# Patient Record
Sex: Female | Born: 1940 | Race: White | Hispanic: No | Marital: Married | Smoking: Former smoker
Health system: Southern US, Community
[De-identification: ages and names within clinical notes are randomized; demographics above are authoritative.]

## PROBLEM LIST (undated history)

## (undated) DIAGNOSIS — F0390 Unspecified dementia without behavioral disturbance: Secondary | ICD-10-CM

## (undated) DIAGNOSIS — I2510 Atherosclerotic heart disease of native coronary artery without angina pectoris: Secondary | ICD-10-CM

## (undated) DIAGNOSIS — C801 Malignant (primary) neoplasm, unspecified: Secondary | ICD-10-CM

## (undated) DIAGNOSIS — J449 Chronic obstructive pulmonary disease, unspecified: Secondary | ICD-10-CM

## (undated) DIAGNOSIS — I209 Angina pectoris, unspecified: Secondary | ICD-10-CM

## (undated) DIAGNOSIS — I10 Essential (primary) hypertension: Secondary | ICD-10-CM

## (undated) HISTORY — PX: MASTECTOMY: SHX3

---

## 2014-09-06 ENCOUNTER — Other Ambulatory Visit: Payer: Self-pay

## 2014-09-08 ENCOUNTER — Inpatient Hospital Stay (HOSPITAL_COMMUNITY)
Admission: EM | Admit: 2014-09-08 | Discharge: 2014-09-24 | DRG: 308 | Disposition: E | Payer: Medicare Other | Attending: Pulmonary Disease | Admitting: Pulmonary Disease

## 2014-09-08 ENCOUNTER — Emergency Department (HOSPITAL_COMMUNITY): Payer: Medicare Other

## 2014-09-08 ENCOUNTER — Encounter (HOSPITAL_COMMUNITY): Payer: Self-pay | Admitting: Emergency Medicine

## 2014-09-08 DIAGNOSIS — E222 Syndrome of inappropriate secretion of antidiuretic hormone: Secondary | ICD-10-CM | POA: Diagnosis present

## 2014-09-08 DIAGNOSIS — J45909 Unspecified asthma, uncomplicated: Secondary | ICD-10-CM | POA: Diagnosis present

## 2014-09-08 DIAGNOSIS — J438 Other emphysema: Secondary | ICD-10-CM

## 2014-09-08 DIAGNOSIS — Z4659 Encounter for fitting and adjustment of other gastrointestinal appliance and device: Secondary | ICD-10-CM

## 2014-09-08 DIAGNOSIS — Y92230 Patient room in hospital as the place of occurrence of the external cause: Secondary | ICD-10-CM

## 2014-09-08 DIAGNOSIS — J9602 Acute respiratory failure with hypercapnia: Secondary | ICD-10-CM | POA: Diagnosis not present

## 2014-09-08 DIAGNOSIS — E872 Acidosis: Secondary | ICD-10-CM | POA: Diagnosis present

## 2014-09-08 DIAGNOSIS — Z8249 Family history of ischemic heart disease and other diseases of the circulatory system: Secondary | ICD-10-CM

## 2014-09-08 DIAGNOSIS — R579 Shock, unspecified: Secondary | ICD-10-CM | POA: Diagnosis not present

## 2014-09-08 DIAGNOSIS — Z515 Encounter for palliative care: Secondary | ICD-10-CM

## 2014-09-08 DIAGNOSIS — T45515A Adverse effect of anticoagulants, initial encounter: Secondary | ICD-10-CM | POA: Diagnosis not present

## 2014-09-08 DIAGNOSIS — E871 Hypo-osmolality and hyponatremia: Secondary | ICD-10-CM

## 2014-09-08 DIAGNOSIS — I10 Essential (primary) hypertension: Secondary | ICD-10-CM | POA: Diagnosis present

## 2014-09-08 DIAGNOSIS — G9340 Encephalopathy, unspecified: Secondary | ICD-10-CM

## 2014-09-08 DIAGNOSIS — J9600 Acute respiratory failure, unspecified whether with hypoxia or hypercapnia: Secondary | ICD-10-CM

## 2014-09-08 DIAGNOSIS — D689 Coagulation defect, unspecified: Secondary | ICD-10-CM | POA: Diagnosis not present

## 2014-09-08 DIAGNOSIS — Z9012 Acquired absence of left breast and nipple: Secondary | ICD-10-CM | POA: Diagnosis present

## 2014-09-08 DIAGNOSIS — J9601 Acute respiratory failure with hypoxia: Secondary | ICD-10-CM

## 2014-09-08 DIAGNOSIS — Z955 Presence of coronary angioplasty implant and graft: Secondary | ICD-10-CM

## 2014-09-08 DIAGNOSIS — D62 Acute posthemorrhagic anemia: Secondary | ICD-10-CM

## 2014-09-08 DIAGNOSIS — I4891 Unspecified atrial fibrillation: Principal | ICD-10-CM | POA: Diagnosis present

## 2014-09-08 DIAGNOSIS — Z66 Do not resuscitate: Secondary | ICD-10-CM | POA: Diagnosis not present

## 2014-09-08 DIAGNOSIS — F419 Anxiety disorder, unspecified: Secondary | ICD-10-CM | POA: Diagnosis not present

## 2014-09-08 DIAGNOSIS — K661 Hemoperitoneum: Secondary | ICD-10-CM | POA: Diagnosis not present

## 2014-09-08 DIAGNOSIS — N179 Acute kidney failure, unspecified: Secondary | ICD-10-CM

## 2014-09-08 DIAGNOSIS — D500 Iron deficiency anemia secondary to blood loss (chronic): Secondary | ICD-10-CM | POA: Diagnosis not present

## 2014-09-08 DIAGNOSIS — R401 Stupor: Secondary | ICD-10-CM

## 2014-09-08 DIAGNOSIS — N390 Urinary tract infection, site not specified: Secondary | ICD-10-CM | POA: Diagnosis present

## 2014-09-08 DIAGNOSIS — R739 Hyperglycemia, unspecified: Secondary | ICD-10-CM | POA: Diagnosis not present

## 2014-09-08 DIAGNOSIS — T4271XA Poisoning by unspecified antiepileptic and sedative-hypnotic drugs, accidental (unintentional), initial encounter: Secondary | ICD-10-CM | POA: Diagnosis not present

## 2014-09-08 DIAGNOSIS — J449 Chronic obstructive pulmonary disease, unspecified: Secondary | ICD-10-CM | POA: Diagnosis present

## 2014-09-08 DIAGNOSIS — F0390 Unspecified dementia without behavioral disturbance: Secondary | ICD-10-CM | POA: Diagnosis present

## 2014-09-08 DIAGNOSIS — Z87891 Personal history of nicotine dependence: Secondary | ICD-10-CM

## 2014-09-08 DIAGNOSIS — E876 Hypokalemia: Secondary | ICD-10-CM | POA: Diagnosis present

## 2014-09-08 DIAGNOSIS — R531 Weakness: Secondary | ICD-10-CM | POA: Diagnosis not present

## 2014-09-08 DIAGNOSIS — I2510 Atherosclerotic heart disease of native coronary artery without angina pectoris: Secondary | ICD-10-CM | POA: Diagnosis present

## 2014-09-08 DIAGNOSIS — Z0189 Encounter for other specified special examinations: Secondary | ICD-10-CM

## 2014-09-08 DIAGNOSIS — K567 Ileus, unspecified: Secondary | ICD-10-CM | POA: Diagnosis not present

## 2014-09-08 DIAGNOSIS — G92 Toxic encephalopathy: Secondary | ICD-10-CM | POA: Diagnosis present

## 2014-09-08 DIAGNOSIS — Z9981 Dependence on supplemental oxygen: Secondary | ICD-10-CM

## 2014-09-08 DIAGNOSIS — R64 Cachexia: Secondary | ICD-10-CM | POA: Diagnosis present

## 2014-09-08 DIAGNOSIS — Z853 Personal history of malignant neoplasm of breast: Secondary | ICD-10-CM

## 2014-09-08 DIAGNOSIS — F13230 Sedative, hypnotic or anxiolytic dependence with withdrawal, uncomplicated: Secondary | ICD-10-CM | POA: Diagnosis not present

## 2014-09-08 DIAGNOSIS — J441 Chronic obstructive pulmonary disease with (acute) exacerbation: Secondary | ICD-10-CM | POA: Diagnosis not present

## 2014-09-08 DIAGNOSIS — R451 Restlessness and agitation: Secondary | ICD-10-CM | POA: Diagnosis not present

## 2014-09-08 HISTORY — DX: Essential (primary) hypertension: I10

## 2014-09-08 HISTORY — DX: Atherosclerotic heart disease of native coronary artery without angina pectoris: I25.10

## 2014-09-08 HISTORY — DX: Unspecified dementia, unspecified severity, without behavioral disturbance, psychotic disturbance, mood disturbance, and anxiety: F03.90

## 2014-09-08 HISTORY — DX: Malignant (primary) neoplasm, unspecified: C80.1

## 2014-09-08 HISTORY — DX: Chronic obstructive pulmonary disease, unspecified: J44.9

## 2014-09-08 HISTORY — DX: Angina pectoris, unspecified: I20.9

## 2014-09-08 LAB — URINALYSIS, ROUTINE W REFLEX MICROSCOPIC
Bilirubin Urine: NEGATIVE
Glucose, UA: NEGATIVE mg/dL
HGB URINE DIPSTICK: NEGATIVE
KETONES UR: NEGATIVE mg/dL
Nitrite: NEGATIVE
Protein, ur: NEGATIVE mg/dL
Specific Gravity, Urine: 1.017 (ref 1.005–1.030)
UROBILINOGEN UA: 0.2 mg/dL (ref 0.0–1.0)
pH: 6 (ref 5.0–8.0)

## 2014-09-08 LAB — COMPREHENSIVE METABOLIC PANEL
ALT: 19 U/L (ref 0–35)
AST: 29 U/L (ref 0–37)
Albumin: 3.5 g/dL (ref 3.5–5.2)
Alkaline Phosphatase: 90 U/L (ref 39–117)
Anion gap: 18 — ABNORMAL HIGH (ref 5–15)
BILIRUBIN TOTAL: 0.4 mg/dL (ref 0.3–1.2)
BUN: 23 mg/dL (ref 6–23)
CHLORIDE: 86 meq/L — AB (ref 96–112)
CO2: 21 meq/L (ref 19–32)
CREATININE: 1.19 mg/dL — AB (ref 0.50–1.10)
Calcium: 10.1 mg/dL (ref 8.4–10.5)
GFR, EST AFRICAN AMERICAN: 52 mL/min — AB (ref >90)
GFR, EST NON AFRICAN AMERICAN: 44 mL/min — AB (ref >90)
Glucose, Bld: 127 mg/dL — ABNORMAL HIGH (ref 70–99)
Potassium: 3.5 mEq/L — ABNORMAL LOW (ref 3.7–5.3)
Sodium: 125 mEq/L — ABNORMAL LOW (ref 137–147)
Total Protein: 7.4 g/dL (ref 6.0–8.3)

## 2014-09-08 LAB — CBC WITH DIFFERENTIAL/PLATELET
Basophils Absolute: 0 10*3/uL (ref 0.0–0.1)
Basophils Relative: 0 % (ref 0–1)
EOS PCT: 2 % (ref 0–5)
Eosinophils Absolute: 0.2 10*3/uL (ref 0.0–0.7)
HCT: 40.4 % (ref 36.0–46.0)
HEMOGLOBIN: 13.7 g/dL (ref 12.0–15.0)
LYMPHS ABS: 1 10*3/uL (ref 0.7–4.0)
LYMPHS PCT: 11 % — AB (ref 12–46)
MCH: 31.3 pg (ref 26.0–34.0)
MCHC: 33.9 g/dL (ref 30.0–36.0)
MCV: 92.2 fL (ref 78.0–100.0)
MONO ABS: 1.2 10*3/uL — AB (ref 0.1–1.0)
Monocytes Relative: 14 % — ABNORMAL HIGH (ref 3–12)
NEUTROS ABS: 6.6 10*3/uL (ref 1.7–7.7)
Neutrophils Relative %: 73 % (ref 43–77)
Platelets: 351 10*3/uL (ref 150–400)
RBC: 4.38 MIL/uL (ref 3.87–5.11)
RDW: 13.4 % (ref 11.5–15.5)
WBC: 9 10*3/uL (ref 4.0–10.5)

## 2014-09-08 LAB — URINE MICROSCOPIC-ADD ON

## 2014-09-08 MED ORDER — SODIUM CHLORIDE 0.9 % IV BOLUS (SEPSIS)
500.0000 mL | Freq: Once | INTRAVENOUS | Status: AC
  Administered 2014-09-08: 500 mL via INTRAVENOUS

## 2014-09-08 MED ORDER — DEXTROSE 5 % IV SOLN
1.0000 g | Freq: Once | INTRAVENOUS | Status: AC
  Administered 2014-09-08: 1 g via INTRAVENOUS
  Filled 2014-09-08: qty 10

## 2014-09-08 MED ORDER — DILTIAZEM HCL 25 MG/5ML IV SOLN
10.0000 mg | Freq: Once | INTRAVENOUS | Status: AC
Start: 2014-09-08 — End: 2014-09-08
  Administered 2014-09-08: 10 mg via INTRAVENOUS
  Filled 2014-09-08: qty 5

## 2014-09-08 NOTE — ED Notes (Signed)
MD at bedside. 

## 2014-09-08 NOTE — ED Provider Notes (Signed)
CSN: [id number]     Arrival date & time 08/29/2014  1927 History   First MD Initiated Contact with Patient  2303     Chief Complaint  Patient presents with  . Weakness    HPI Last week she was having trouble with weakness which brought her into the ED.    She was admitted to the hospital and released.  Treated for a UTI.  She travelled here to see her brother.  In the last several days she has had trouble with poor appetite again, general weakness, shaky.  When she got up last night she seemed confused.  She was talking about her daughter but it didn't make any sense to her husband.  That did not persist today but they were concerned and brought her to the ED here.  No chest pain.  Occsnl shortness of breath.  No vomiting, occsnl loose stools.  No dysuria.  No fever.  No history of irregular heart rhythms. Past Medical History  Diagnosis Date  . Hypertension   . Cancer   . COPD (chronic obstructive pulmonary disease)    Past Surgical History  Procedure Laterality Date  . Mastectomy     No family history on file. History  Substance Use Topics  . Smoking status: Never Smoker   . Smokeless tobacco: Not on file  . Alcohol Use: Yes   OB History   Grav Para Term Preterm Abortions TAB SAB Ect Mult Living                 Review of Systems  All other systems reviewed and are negative.     Allergies  Review of patient's allergies indicates no known allergies.  Home Medications   Prior to Admission medications   Not on File   BP 115/58  Pulse 107  Temp(Src) 97.3 F (36.3 C)  Resp 23  SpO2 91% Physical Exam  Nursing note and vitals reviewed. Constitutional: No distress.  HENT:  Head: Normocephalic and atraumatic.  Right Ear: External ear normal.  Left Ear: External ear normal.  Eyes: Conjunctivae are normal. Right eye exhibits no discharge. Left eye exhibits no discharge. No scleral icterus.  Neck: Neck supple. No tracheal deviation present.  Cardiovascular:  Intact distal pulses.  An irregularly irregular rhythm present. Tachycardia present.   Pulmonary/Chest: Effort normal and breath sounds normal. No stridor. No respiratory distress. She has no wheezes. She has no rales.  Abdominal: Soft. Bowel sounds are normal. She exhibits no distension. There is no tenderness. There is no rebound and no guarding.  Musculoskeletal: She exhibits no edema and no tenderness.  Neurological: She is alert. She has normal strength. No cranial nerve deficit (no facial droop, extraocular movements intact, no slurred speech) or sensory deficit. She exhibits normal muscle tone. She displays no seizure activity. Coordination normal.  Skin: Skin is warm and dry. No rash noted. She is not diaphoretic.  Psychiatric: She has a normal mood and affect.    ED Course  Procedures (including critical care time) CRITICAL CARE Performed by: EXBMW,UXL Total critical care time: 35 Critical care time was exclusive of separately billable procedures and treating other patients. Critical care was necessary to treat or prevent imminent or life-threatening deterioration. Critical care was time spent personally by me on the following activities: development of treatment plan with patient and/or surrogate as well as nursing, discussions with consultants, evaluation of patient's response to treatment, examination of patient, obtaining history from patient or surrogate, ordering and performing treatments and  interventions, ordering and review of laboratory studies, ordering and review of radiographic studies, pulse oximetry and re-evaluation of patient's condition.  Labs Review Labs Reviewed  CBC WITH DIFFERENTIAL - Abnormal; Notable for the following:    Lymphocytes Relative 11 (*)    Monocytes Relative 14 (*)    Monocytes Absolute 1.2 (*)    All other components within normal limits  COMPREHENSIVE METABOLIC PANEL - Abnormal; Notable for the following:    Sodium 125 (*)    Potassium 3.5  (*)    Chloride 86 (*)    Glucose, Bld 127 (*)    Creatinine, Ser 1.19 (*)    GFR calc non Af Amer 44 (*)    GFR calc Af Amer 52 (*)    Anion gap 18 (*)    All other components within normal limits  URINALYSIS, ROUTINE W REFLEX MICROSCOPIC - Abnormal; Notable for the following:    APPearance CLOUDY (*)    Leukocytes, UA SMALL (*)    All other components within normal limits  URINE CULTURE  URINE MICROSCOPIC-ADD ON    Imaging Review No results found.   EKG Interpretation   Date/Time:  Friday September 08 2014 23:06:25 EDT Ventricular Rate:  139 PR Interval:    QRS Duration: 74 QT Interval:  287 QTC Calculation: 436 R Axis:   92 Text Interpretation:  Atrial fibrillation Ventricular premature complex  Right axis deviation Consider left ventricular hypertrophy Nonspecific T  abnormalities, lateral leads Artifact in lead(s) I II III aVR aVL aVF V1  V2 V3 V4 V5 poor quality, flutter vs fib? Confirmed by Joellyn Grandt  MD-J, Eden Toohey  [id number]) on 09/01/2014 11:08:32 PM     Medications  diltiazem (CARDIZEM) injection 10 mg (0 mg Intravenous Stopped 09/07/2014 2324)  sodium chloride 0.9 % bolus 500 mL (500 mLs Intravenous New Bag/Given 09/03/2014 2319)  cefTRIAXone (ROCEPHIN) 1 g in dextrose 5 % 50 mL IVPB (1 g Intravenous New Bag/Given 08/24/2014 2321)   HR improved with cardizem bolus.  Most recent 107.  Will admit to stepdown unit. MDM   Final diagnoses:  Atrial fibrillation, rapid  Hyponatremia  UTI (lower urinary tract infection)   A fib with rapid rate noted on the monitor while I was a the bedside.  Pt may be having episodes of a fib with RVR that has been causing her weakness.  She also has hyponatremia and UA still suggest a uti.   Will give dose of cardizem.  Start abx for uti.  Admit to medical service.  No focal neuro signs at this time to suggest stroke but will not    Dorie Rank, MD 09/09/14 [phone]

## 2014-09-08 NOTE — ED Notes (Signed)
Pt. reports generalized weakness , poor appetite , and diarrhea last night. Pt. had just completed her oral antibiotic for UTI yesterday , denies fever or chills.

## 2014-09-09 ENCOUNTER — Encounter (HOSPITAL_COMMUNITY): Payer: Self-pay | Admitting: *Deleted

## 2014-09-09 DIAGNOSIS — I4891 Unspecified atrial fibrillation: Secondary | ICD-10-CM | POA: Diagnosis present

## 2014-09-09 DIAGNOSIS — F419 Anxiety disorder, unspecified: Secondary | ICD-10-CM | POA: Diagnosis not present

## 2014-09-09 DIAGNOSIS — Z515 Encounter for palliative care: Secondary | ICD-10-CM | POA: Diagnosis not present

## 2014-09-09 DIAGNOSIS — J449 Chronic obstructive pulmonary disease, unspecified: Secondary | ICD-10-CM | POA: Diagnosis present

## 2014-09-09 DIAGNOSIS — Y92230 Patient room in hospital as the place of occurrence of the external cause: Secondary | ICD-10-CM | POA: Diagnosis not present

## 2014-09-09 DIAGNOSIS — N390 Urinary tract infection, site not specified: Secondary | ICD-10-CM

## 2014-09-09 DIAGNOSIS — D689 Coagulation defect, unspecified: Secondary | ICD-10-CM | POA: Diagnosis not present

## 2014-09-09 DIAGNOSIS — G9340 Encephalopathy, unspecified: Secondary | ICD-10-CM

## 2014-09-09 DIAGNOSIS — K567 Ileus, unspecified: Secondary | ICD-10-CM | POA: Diagnosis not present

## 2014-09-09 DIAGNOSIS — Z87891 Personal history of nicotine dependence: Secondary | ICD-10-CM | POA: Diagnosis not present

## 2014-09-09 DIAGNOSIS — T45515A Adverse effect of anticoagulants, initial encounter: Secondary | ICD-10-CM | POA: Diagnosis not present

## 2014-09-09 DIAGNOSIS — Z9012 Acquired absence of left breast and nipple: Secondary | ICD-10-CM | POA: Diagnosis present

## 2014-09-09 DIAGNOSIS — G92 Toxic encephalopathy: Secondary | ICD-10-CM | POA: Diagnosis present

## 2014-09-09 DIAGNOSIS — I2510 Atherosclerotic heart disease of native coronary artery without angina pectoris: Secondary | ICD-10-CM

## 2014-09-09 DIAGNOSIS — N179 Acute kidney failure, unspecified: Secondary | ICD-10-CM | POA: Diagnosis not present

## 2014-09-09 DIAGNOSIS — E222 Syndrome of inappropriate secretion of antidiuretic hormone: Secondary | ICD-10-CM | POA: Diagnosis present

## 2014-09-09 DIAGNOSIS — J45909 Unspecified asthma, uncomplicated: Secondary | ICD-10-CM | POA: Diagnosis present

## 2014-09-09 DIAGNOSIS — T4271XA Poisoning by unspecified antiepileptic and sedative-hypnotic drugs, accidental (unintentional), initial encounter: Secondary | ICD-10-CM | POA: Diagnosis not present

## 2014-09-09 DIAGNOSIS — Z8249 Family history of ischemic heart disease and other diseases of the circulatory system: Secondary | ICD-10-CM | POA: Diagnosis not present

## 2014-09-09 DIAGNOSIS — R739 Hyperglycemia, unspecified: Secondary | ICD-10-CM | POA: Diagnosis not present

## 2014-09-09 DIAGNOSIS — Z9981 Dependence on supplemental oxygen: Secondary | ICD-10-CM | POA: Diagnosis not present

## 2014-09-09 DIAGNOSIS — R451 Restlessness and agitation: Secondary | ICD-10-CM | POA: Diagnosis not present

## 2014-09-09 DIAGNOSIS — E876 Hypokalemia: Secondary | ICD-10-CM | POA: Diagnosis present

## 2014-09-09 DIAGNOSIS — E872 Acidosis: Secondary | ICD-10-CM | POA: Diagnosis present

## 2014-09-09 DIAGNOSIS — J9602 Acute respiratory failure with hypercapnia: Secondary | ICD-10-CM | POA: Diagnosis not present

## 2014-09-09 DIAGNOSIS — I10 Essential (primary) hypertension: Secondary | ICD-10-CM

## 2014-09-09 DIAGNOSIS — J441 Chronic obstructive pulmonary disease with (acute) exacerbation: Secondary | ICD-10-CM | POA: Diagnosis not present

## 2014-09-09 DIAGNOSIS — K661 Hemoperitoneum: Secondary | ICD-10-CM | POA: Diagnosis not present

## 2014-09-09 DIAGNOSIS — D500 Iron deficiency anemia secondary to blood loss (chronic): Secondary | ICD-10-CM | POA: Diagnosis not present

## 2014-09-09 DIAGNOSIS — Z853 Personal history of malignant neoplasm of breast: Secondary | ICD-10-CM | POA: Diagnosis not present

## 2014-09-09 DIAGNOSIS — R579 Shock, unspecified: Secondary | ICD-10-CM | POA: Diagnosis not present

## 2014-09-09 DIAGNOSIS — R64 Cachexia: Secondary | ICD-10-CM | POA: Diagnosis present

## 2014-09-09 DIAGNOSIS — Z66 Do not resuscitate: Secondary | ICD-10-CM | POA: Diagnosis not present

## 2014-09-09 DIAGNOSIS — F13230 Sedative, hypnotic or anxiolytic dependence with withdrawal, uncomplicated: Secondary | ICD-10-CM | POA: Diagnosis not present

## 2014-09-09 DIAGNOSIS — J438 Other emphysema: Secondary | ICD-10-CM

## 2014-09-09 DIAGNOSIS — F0390 Unspecified dementia without behavioral disturbance: Secondary | ICD-10-CM | POA: Diagnosis present

## 2014-09-09 DIAGNOSIS — E871 Hypo-osmolality and hyponatremia: Secondary | ICD-10-CM | POA: Diagnosis present

## 2014-09-09 DIAGNOSIS — R531 Weakness: Secondary | ICD-10-CM | POA: Diagnosis present

## 2014-09-09 DIAGNOSIS — Z955 Presence of coronary angioplasty implant and graft: Secondary | ICD-10-CM | POA: Diagnosis not present

## 2014-09-09 LAB — NA AND K (SODIUM & POTASSIUM), RAND UR
Potassium Urine: 57 mEq/L
Sodium, Ur: 59 mEq/L

## 2014-09-09 LAB — CBC
HCT: 36.7 % (ref 36.0–46.0)
Hemoglobin: 12.8 g/dL (ref 12.0–15.0)
MCH: 32.2 pg (ref 26.0–34.0)
MCHC: 34.9 g/dL (ref 30.0–36.0)
MCV: 92.4 fL (ref 78.0–100.0)
PLATELETS: 305 10*3/uL (ref 150–400)
RBC: 3.97 MIL/uL (ref 3.87–5.11)
RDW: 13.5 % (ref 11.5–15.5)
WBC: 9.1 10*3/uL (ref 4.0–10.5)

## 2014-09-09 LAB — BASIC METABOLIC PANEL
Anion gap: 15 (ref 5–15)
BUN: 22 mg/dL (ref 6–23)
CO2: 20 mEq/L (ref 19–32)
Calcium: 9.3 mg/dL (ref 8.4–10.5)
Chloride: 89 mEq/L — ABNORMAL LOW (ref 96–112)
Creatinine, Ser: 1.09 mg/dL (ref 0.50–1.10)
GFR calc Af Amer: 57 mL/min — ABNORMAL LOW (ref >90)
GFR, EST NON AFRICAN AMERICAN: 49 mL/min — AB (ref >90)
Glucose, Bld: 114 mg/dL — ABNORMAL HIGH (ref 70–99)
POTASSIUM: 3 meq/L — AB (ref 3.7–5.3)
SODIUM: 124 meq/L — AB (ref 137–147)

## 2014-09-09 LAB — OSMOLALITY, URINE: OSMOLALITY UR: 454 mosm/kg (ref 390–1090)

## 2014-09-09 LAB — TSH: TSH: 1.01 u[IU]/mL (ref 0.350–4.500)

## 2014-09-09 LAB — MRSA PCR SCREENING: MRSA BY PCR: NEGATIVE

## 2014-09-09 MED ORDER — ONDANSETRON HCL 4 MG/2ML IJ SOLN: 4.0000 mg | Freq: Four times a day (QID) | INTRAMUSCULAR | Status: DC | PRN

## 2014-09-09 MED ORDER — ACETAMINOPHEN 325 MG PO TABS
650.0000 mg | ORAL_TABLET | Freq: Four times a day (QID) | ORAL | Status: DC | PRN
  Administered 2014-09-10: 650 mg via ORAL
  Filled 2014-09-09: qty 2

## 2014-09-09 MED ORDER — DILTIAZEM HCL 30 MG PO TABS
30.0000 mg | ORAL_TABLET | Freq: Four times a day (QID) | ORAL | Status: AC
  Administered 2014-09-10 (×4): 30 mg via ORAL
  Filled 2014-09-09 (×6): qty 1

## 2014-09-09 MED ORDER — DEXTROSE 5 % IV SOLN
1.0000 g | INTRAVENOUS | Status: DC
  Administered 2014-09-09 – 2014-09-11 (×3): 1 g via INTRAVENOUS
  Filled 2014-09-09 (×6): qty 10

## 2014-09-09 MED ORDER — DILTIAZEM HCL 100 MG IV SOLR
5.0000 mg/h | INTRAVENOUS | Status: AC
  Administered 2014-09-09: 5 mg/h via INTRAVENOUS
  Administered 2014-09-09: 10 mg/h via INTRAVENOUS

## 2014-09-09 MED ORDER — ALUM & MAG HYDROXIDE-SIMETH 200-200-20 MG/5ML PO SUSP: 30.0000 mL | Freq: Four times a day (QID) | ORAL | Status: DC | PRN

## 2014-09-09 MED ORDER — BUPROPION HCL ER (SR) 150 MG PO TB12
150.0000 mg | ORAL_TABLET | Freq: Two times a day (BID) | ORAL | Status: DC
  Administered 2014-09-09 – 2014-09-11 (×5): 150 mg via ORAL
  Filled 2014-09-09 (×9): qty 1

## 2014-09-09 MED ORDER — OXYCODONE HCL 5 MG PO TABS: 5.0000 mg | ORAL_TABLET | ORAL | Status: DC | PRN

## 2014-09-09 MED ORDER — METOPROLOL TARTRATE 12.5 MG HALF TABLET
12.5000 mg | ORAL_TABLET | Freq: Two times a day (BID) | ORAL | Status: DC
  Administered 2014-09-09 – 2014-09-11 (×5): 12.5 mg via ORAL
  Filled 2014-09-09 (×6): qty 1

## 2014-09-09 MED ORDER — ATORVASTATIN CALCIUM 80 MG PO TABS
80.0000 mg | ORAL_TABLET | Freq: Every day | ORAL | Status: DC
  Administered 2014-09-09 – 2014-09-10 (×2): 80 mg via ORAL
  Filled 2014-09-09 (×4): qty 1

## 2014-09-09 MED ORDER — ATORVASTATIN CALCIUM 80 MG PO TABS
80.0000 mg | ORAL_TABLET | Freq: Every day | ORAL | Status: DC
  Filled 2014-09-09: qty 1

## 2014-09-09 MED ORDER — POTASSIUM CHLORIDE CRYS ER 20 MEQ PO TBCR
40.0000 meq | EXTENDED_RELEASE_TABLET | Freq: Two times a day (BID) | ORAL | Status: AC
  Administered 2014-09-09 – 2014-09-10 (×3): 40 meq via ORAL
  Filled 2014-09-09 (×3): qty 2

## 2014-09-09 MED ORDER — ENOXAPARIN SODIUM 60 MG/0.6ML ~~LOC~~ SOLN
50.0000 mg | Freq: Two times a day (BID) | SUBCUTANEOUS | Status: DC
  Administered 2014-09-09 – 2014-09-12 (×8): 50 mg via SUBCUTANEOUS
  Filled 2014-09-09 (×12): qty 0.6

## 2014-09-09 MED ORDER — RALOXIFENE HCL 60 MG PO TABS
60.0000 mg | ORAL_TABLET | Freq: Every day | ORAL | Status: DC
  Administered 2014-09-09 – 2014-09-11 (×3): 60 mg via ORAL
  Filled 2014-09-09 (×3): qty 1

## 2014-09-09 MED ORDER — ALPRAZOLAM 0.5 MG PO TABS
1.0000 mg | ORAL_TABLET | Freq: Four times a day (QID) | ORAL | Status: DC | PRN
  Filled 2014-09-09: qty 2

## 2014-09-09 MED ORDER — ACETAMINOPHEN 650 MG RE SUPP: 650.0000 mg | Freq: Four times a day (QID) | RECTAL | Status: DC | PRN

## 2014-09-09 MED ORDER — ALBUTEROL SULFATE HFA 108 (90 BASE) MCG/ACT IN AERS: 2.0000 | INHALATION_SPRAY | Freq: Four times a day (QID) | RESPIRATORY_TRACT | Status: DC | PRN

## 2014-09-09 MED ORDER — VITAMIN D3 25 MCG (1000 UNIT) PO TABS
5000.0000 [IU] | ORAL_TABLET | Freq: Every day | ORAL | Status: DC
  Administered 2014-09-09 – 2014-09-11 (×3): 5000 [IU] via ORAL
  Filled 2014-09-09 (×3): qty 5

## 2014-09-09 MED ORDER — SODIUM CHLORIDE 0.9 % IV SOLN
INTRAVENOUS | Status: DC
  Administered 2014-09-09: 16:00:00 via INTRAVENOUS
  Administered 2014-09-09 – 2014-09-10 (×2): 75 mL/h via INTRAVENOUS
  Administered 2014-09-10: 11:00:00 via INTRAVENOUS
  Administered 2014-09-11: 75 mL/h via INTRAVENOUS

## 2014-09-09 MED ORDER — ASPIRIN EC 81 MG PO TBEC
81.0000 mg | DELAYED_RELEASE_TABLET | Freq: Every day | ORAL | Status: DC
  Administered 2014-09-09 – 2014-09-11 (×3): 81 mg via ORAL
  Filled 2014-09-09 (×3): qty 1

## 2014-09-09 MED ORDER — HYDROMORPHONE HCL 1 MG/ML IJ SOLN: 0.5000 mg | INTRAMUSCULAR | Status: DC | PRN

## 2014-09-09 MED ORDER — IPRATROPIUM-ALBUTEROL 0.5-2.5 (3) MG/3ML IN SOLN: 3.0000 mL | Freq: Four times a day (QID) | RESPIRATORY_TRACT | Status: DC | PRN

## 2014-09-09 MED ORDER — ONDANSETRON HCL 4 MG PO TABS: 4.0000 mg | ORAL_TABLET | Freq: Four times a day (QID) | ORAL | Status: DC | PRN

## 2014-09-09 NOTE — H&P (Addendum)
[facility] Admission History and Physical       [name]:[id number] DOB: 1941-09-06 DOA: 09/18/2014  Referring physician:  EDP PCP: No primary provider on file.  Specialists:   Chief Complaint: Weakness  HPI: Carmen Dean is a 73 y.o. female with a history of COPD, Asthma, and Remote history of Left Breast Cancer S/P Mastectomy  (1998) who presents to the ED with complaints of weakness and poor appetite and confusion for the past 4 days since she was discharged from the hospital in [place] where she had been admitted on 10/07 and treated for a UTI.   She was evaluated in the ED and was found have Atrial Fibrillation with RVR up to a rate of 148.   She was also found to have Hyponatremia with a sodium level of 125.  She denied having any chest pain or palpitations.  She was administered IV Cardizem 10 mg X 1 dose which temporarily improved her rate, and then she was started on and IV Cardizem drip and referred for medical admission.     Review of Systems:  Constitutional: No Weight Loss, No Weight Gain, Night Sweats, Fevers, Chills, Dizziness, Fatigue, +Generalized Weakness HEENT: No Headaches, Difficulty Swallowing,Tooth/Dental Problems,Sore Throat,  No Sneezing, Rhinitis, Ear Ache, Nasal Congestion, or Post Nasal Drip,  Cardio-vascular:  No Chest pain, Orthopnea, PND, Edema in Lower Extremities, Anasarca, Dizziness, Palpitations  Resp: No Dyspnea, No DOE, No Productive Cough, No Non-Productive Cough, No Hemoptysis, No Wheezing.    GI: No Heartburn, Indigestion, Abdominal Pain, Nausea, Vomiting, Diarrhea, Hematemesis, Hematochezia, Melena, Change in Bowel Habits,  +Loss of Appetite  GU: No Dysuria, Change in Color of Urine, No Urgency or Frequency, No Flank pain.  Musculoskeletal: No Joint Pain or Swelling, No Decreased Range of Motion, No Back Pain.  Neurologic: No Syncope, No Seizures, Muscle Weakness, Paresthesia, Vision Disturbance or Loss, No Diplopia,  No Vertigo, No Difficulty Walking,  Skin: No Rash or Lesions. Psych: No Change in Mood or Affect, No Depression or Anxiety, No Memory loss, +Confusion, or Hallucinations   Past Medical History  Diagnosis Date  . Hypertension   . Cancer   . COPD (chronic obstructive pulmonary disease)       Past Surgical History  Procedure Laterality Date  . Mastectomy         Prior to Admission medications   Medication Sig Start Date End Date Taking? Authorizing Provider  albuterol (PROVENTIL HFA;VENTOLIN HFA) 108 (90 BASE) MCG/ACT inhaler Inhale 2 puffs into the lungs every 6 (six) hours as needed for wheezing or shortness of breath.   Yes Historical Provider, MD  ALPRAZolam Duanne Moron) 1 MG tablet Take 1 mg by mouth 4 (four) times daily as needed for anxiety.   Yes Historical Provider, MD  aspirin EC 81 MG tablet Take 81 mg by mouth daily.   Yes Historical Provider, MD  atorvastatin (LIPITOR) 80 MG tablet Take 80 mg by mouth daily.   Yes Historical Provider, MD  buPROPion (WELLBUTRIN SR) 150 MG 12 hr tablet Take 150 mg by mouth 2 (two) times daily.   Yes Historical Provider, MD  Cholecalciferol (VITAMIN D-3) 5000 UNITS TABS Take 5,000 Units by mouth daily.   Yes Historical Provider, MD  hydrochlorothiazide (HYDRODIURIL) 25 MG tablet Take 25 mg by mouth daily.   Yes Historical Provider, MD  Ipratropium-Albuterol (COMBIVENT RESPIMAT) 20-100 MCG/ACT AERS respimat Inhale 1 puff into the lungs every 6 (six) hours as needed for wheezing.   Yes Historical Provider, MD  metoprolol tartrate (LOPRESSOR) 25 MG tablet Take 12.5 mg by mouth 2 (two) times daily.   Yes Historical Provider, MD  Propylene Glycol (SYSTANE BALANCE OP) Place 2 drops into both eyes 2 (two) times daily as needed (dry eyes).   Yes Historical Provider, MD  raloxifene (EVISTA) 60 MG tablet Take 60 mg by mouth daily.   Yes Historical Provider, MD      No Known Allergies   Social History:  reports that she has never smoked. She does not  have any smokeless tobacco history on file. She reports that she drinks alcohol. She reports that she does not use illicit drugs.     No family history on file.     Physical Exam:  GEN:  Pleasant Elderly Well Nourished and Well Developed  73 y.o. Caucasian female examined and in no acute distress; cooperative with exam Filed Vitals:   09/20/2014 2256 08/24/2014 2344 09/09/14 0000 09/09/14 0107  BP: 118/68 125/51 115/58 127/59  Pulse: 148  107 130  Temp:      Resp: 36  23 19  SpO2: 94%  91% 99%   Blood pressure 127/59, pulse 130, temperature 97.3 F (36.3 C), resp. rate 19, SpO2 99.00%. PSYCH: She is alert and oriented x4; does not appear anxious does not appear depressed; affect is normal HEENT: Normocephalic and Atraumatic, Mucous membranes pink; PERRLA; EOM intact; Fundi:  Benign;  No scleral icterus, Nares: Patent, Oropharynx: Clear, Edentulous,    Neck:  FROM, No Cervical Lymphadenopathy nor Thyromegaly or Carotid Bruit; No JVD; Breasts:: Not examined CHEST WALL: No tenderness CHEST: Normal respiration, clear to auscultation bilaterally HEART: Regular rate and rhythm; no murmurs rubs or gallops BACK: No kyphosis or scoliosis; No CVA tenderness ABDOMEN: Positive Bowel Sounds, Soft Non-Tender; No Masses, No Organomegaly Rectal Exam: Not done EXTREMITIES: No Cyanosis, Clubbing, or Edema; No Ulcerations. Genitalia: not examined PULSES: 2+ and symmetric SKIN: Normal hydration no rash or ulceration CNS:  Alert and Oriented x 4, No focal Deficits Vascular: pulses palpable throughout    Labs on Admission:  Basic Metabolic Panel:  Recent Labs Lab 08/30/2014 1958  NA 125*  K 3.5*  CL 86*  CO2 21  GLUCOSE 127*  BUN 23  CREATININE 1.19*  CALCIUM 10.1   Liver Function Tests:  Recent Labs Lab 09/04/2014 1958  AST 29  ALT 19  ALKPHOS 90  BILITOT 0.4  PROT 7.4  ALBUMIN 3.5   No results found for this basename: LIPASE, AMYLASE,  in the last 168 hours No results found for  this basename: AMMONIA,  in the last 168 hours CBC:  Recent Labs Lab 08/31/2014 1958  WBC 9.0  NEUTROABS 6.6  HGB 13.7  HCT 40.4  MCV 92.2  PLT 351   Cardiac Enzymes: No results found for this basename: CKTOTAL, CKMB, CKMBINDEX, TROPONINI,  in the last 168 hours  BNP (last 3 results) No results found for this basename: PROBNP,  in the last 8760 hours CBG: No results found for this basename: GLUCAP,  in the last 168 hours  Radiological Exams on Admission: Dg Chest Portable 1 View  09/09/2014   CLINICAL DATA:  Weakness.  Loss of appetite.  Initial encounter  EXAM: PORTABLE CHEST - 1 VIEW  COMPARISON:  None.  FINDINGS: No cardiomegaly.  Negative aortic contours.  Pulmonary hyperinflation and apical emphysematous changes. No edema, effusion, pneumothorax, or convincing pneumonia. Slight increased density at the peripheral left base is likely from overlapping shadows, including EKG leads.  Left axillary dissection  and mastectomy.  IMPRESSION: 1.  No active disease. 2. COPD.   Electronically Signed   By: Jorje Guild M.D.   On: 09/09/2014 00:28     EKG: Independently reviewed. Atrial Fibrillation rate 139   Assessment/Plan:   73 y.o. female with   Principal Problem:   1.   Atrial fibrillation with RVR   IV Cardizem Drip    Cardiac Monitoring   Check TSH Level   2D ECHO in AM     Active Problems:   2.   Hyponatremia- due to HCTZ Rx   Send Urine OSMs, and Electrolytes   IVFs with NSS       3.   UTI (lower urinary tract infection)- partially Treated   Urine C+S sent   IV Rocephin    IVFs     4.   Acute encephalopathy- Most Likely Metabolic due to #2, and #1   Monitor      5.   Essential hypertension   Monitor BPs   Continue Metoprolol Rx       6.   COPD (chronic obstructive pulmonary disease)- Stable   Continue Home regimen PRN      7.   CAD (coronary artery disease)- stable   Continue Metoprolol, Atorvastatin, and ASA rx   Cardiac Monitoring        8.   DVT Prophylaxis   Full dose Lovenox   Transition to Coumadin Rx or newer Agent      Code Status:   FULL CODE Family Communication:    Husband at bedside Disposition Plan:       Inpatient  Time spent:  [place] [facility] Pager [phone]   If [place] Please Contact the Day Rounding Team MD for [facility]  If 7PM-7AM, Please Contact Night-Floor Coverage  [email or web address] Password [email or web address] 09/09/2014, 1:39 AM

## 2014-09-09 NOTE — ED Notes (Signed)
Pharmacy called for daily medications 

## 2014-09-09 NOTE — ED Notes (Signed)
Lunch tray ordered 

## 2014-09-09 NOTE — ED Notes (Signed)
Meal ordered

## 2014-09-09 NOTE — Progress Notes (Signed)
[place] TEAM 1 - Stepdown/ICU TEAM Progress Note  MONITA SWIER QIW:[id number] DOB: December 01, 1940 DOA: 09/20/2014 PCP: No primary provider on file.  Admit HPI / Brief Narrative: 73 y.o. female with a history of COPD, Asthma, and remote history of Left Breast Cancer S/P Mastectomy (1998) who presented to the ED with complaints of weakness and poor appetite and confusion for 4 days since she was discharged from a hospital in [place] where she had been admitted on 10/07 for a UTI.   In the ED she was found to be in Atrial Fibrillation with RVR up to a rate of 148. She was also found to have Hyponatremia with a sodium level of 125. She denied chest pain or palpitations. She was administered IV Cardizem 10 mg X 1 dose which temporarily improved her rate, and then she was started on and IV Cardizem drip.   HPI/Subjective: Pt seen for f/u visit.  Assessment/Plan:  Acute afib w/ RVR  cardizem gtt - full dose lovenox  Hyponatremia Stopped HCTZ  UTI  Toxic metabolic encephalopathy  Hypokalemia Check Mg  HTN  COPD  Hx of CAD  Remote hx of L breast CA  Code Status: FULL Family Communication: husband at bedside  Disposition Plan: SDU  Consultants: None  Procedures: none  Antibiotics: Rocephin 10/16 >  DVT prophylaxis: Full dose lovenox  Objective: Blood pressure 105/42, pulse 64, temperature 97.3 F (36.3 C), resp. rate 21, height 5\' 4"  (1.626 m), weight 48.988 kg (108 lb), SpO2 98.00%. No intake or output data in the 24 hours ending 09/09/14 1357  Exam: F/u exam completed  Data Reviewed: Basic Metabolic Panel:  Recent Labs Lab 09/19/2014 1958 09/09/14 0506  NA 125* 124*  K 3.5* 3.0*  CL 86* 89*  CO2 21 20  GLUCOSE 127* 114*  BUN 23 22  CREATININE 1.19* 1.09  CALCIUM 10.1 9.3    Liver Function Tests:  Recent Labs Lab 08/28/2014 1958  AST 29  ALT 19  ALKPHOS 90  BILITOT 0.4  PROT 7.4  ALBUMIN 3.5    Coags: No results found for this  basename: PT, INR,  in the last 168 hours No results found for this basename: PTT,  in the last 168 hours  CBC:  Recent Labs Lab 09/06/2014 1958 09/09/14 0506  WBC 9.0 9.1  NEUTROABS 6.6  --   HGB 13.7 12.8  HCT 40.4 36.7  MCV 92.2 92.4  PLT 351 305    Cardiac Enzymes: No results found for this basename: CKTOTAL, CKMB, CKMBINDEX, TROPONINI,  in the last 168 hours BNP (last 3 results) No results found for this basename: PROBNP,  in the last 8760 hours  CBG: No results found for this basename: GLUCAP,  in the last 168 hours  No results found for this or any previous visit (from the past 240 hour(s)).   Studies:  Recent x-ray studies have been reviewed in detail by the Attending Physician  Scheduled Meds:  Scheduled Meds: . aspirin EC  81 mg Oral Daily  . atorvastatin  80 mg Oral q1800  . buPROPion  150 mg Oral BID WC  . cholecalciferol  5,000 Units Oral Daily  . enoxaparin (LOVENOX) injection  50 mg Subcutaneous BID  . metoprolol tartrate  12.5 mg Oral BID  . raloxifene  60 mg Oral Daily    Time spent on care of this patient: 25+ mins   Khris Jansson T , MD   [facility] Office  [phone] Pager - Text Page per  Amion as per below:  On-Call/Text Page:      [email or web address]      password [email or web address]  If 7PM-7AM, please contact night-coverage [email or web address] Password [email or web address] 09/09/2014, 1:57 PM   LOS: 1 day

## 2014-09-09 NOTE — ED Notes (Signed)
Admitting MD paged regarding cardizem drip

## 2014-09-10 ENCOUNTER — Encounter (HOSPITAL_COMMUNITY): Payer: Self-pay

## 2014-09-10 DIAGNOSIS — I369 Nonrheumatic tricuspid valve disorder, unspecified: Secondary | ICD-10-CM

## 2014-09-10 LAB — COMPREHENSIVE METABOLIC PANEL
ALBUMIN: 2.6 g/dL — AB (ref 3.5–5.2)
ALT: 17 U/L (ref 0–35)
AST: 24 U/L (ref 0–37)
Alkaline Phosphatase: 73 U/L (ref 39–117)
Anion gap: 11 (ref 5–15)
BUN: 16 mg/dL (ref 6–23)
CALCIUM: 8.8 mg/dL (ref 8.4–10.5)
CO2: 19 mEq/L (ref 19–32)
Chloride: 98 mEq/L (ref 96–112)
Creatinine, Ser: 0.8 mg/dL (ref 0.50–1.10)
GFR calc non Af Amer: 72 mL/min — ABNORMAL LOW (ref >90)
GFR, EST AFRICAN AMERICAN: 83 mL/min — AB (ref >90)
Glucose, Bld: 112 mg/dL — ABNORMAL HIGH (ref 70–99)
Potassium: 4.9 mEq/L (ref 3.7–5.3)
SODIUM: 128 meq/L — AB (ref 137–147)
TOTAL PROTEIN: 5.9 g/dL — AB (ref 6.0–8.3)
Total Bilirubin: <0.2 mg/dL — ABNORMAL LOW (ref 0.3–1.2)

## 2014-09-10 LAB — CBC
HCT: 35.9 % — ABNORMAL LOW (ref 36.0–46.0)
Hemoglobin: 11.6 g/dL — ABNORMAL LOW (ref 12.0–15.0)
MCH: 30.7 pg (ref 26.0–34.0)
MCHC: 32.3 g/dL (ref 30.0–36.0)
MCV: 95 fL (ref 78.0–100.0)
PLATELETS: 335 10*3/uL (ref 150–400)
RBC: 3.78 MIL/uL — AB (ref 3.87–5.11)
RDW: 13.9 % (ref 11.5–15.5)
WBC: 10.1 10*3/uL (ref 4.0–10.5)

## 2014-09-10 LAB — URINE CULTURE: Colony Count: 9000

## 2014-09-10 LAB — MAGNESIUM: MAGNESIUM: 1.6 mg/dL (ref 1.5–2.5)

## 2014-09-10 MED ORDER — DILTIAZEM HCL ER COATED BEADS 180 MG PO CP24
180.0000 mg | ORAL_CAPSULE | Freq: Every day | ORAL | Status: DC
  Administered 2014-09-11: 180 mg via ORAL
  Filled 2014-09-10: qty 1

## 2014-09-10 MED ORDER — LEVALBUTEROL HCL 0.63 MG/3ML IN NEBU
0.6300 mg | INHALATION_SOLUTION | RESPIRATORY_TRACT | Status: DC | PRN
  Administered 2014-09-10 – 2014-09-11 (×4): 0.63 mg via RESPIRATORY_TRACT
  Filled 2014-09-10 (×4): qty 3

## 2014-09-10 MED ORDER — MAGNESIUM SULFATE 40 MG/ML IJ SOLN
2.0000 g | Freq: Once | INTRAMUSCULAR | Status: AC
  Administered 2014-09-10: 2 g via INTRAVENOUS
  Filled 2014-09-10: qty 50

## 2014-09-10 MED ORDER — CETYLPYRIDINIUM CHLORIDE 0.05 % MT LIQD
7.0000 mL | Freq: Two times a day (BID) | OROMUCOSAL | Status: DC
  Administered 2014-09-10 (×3): 7 mL via OROMUCOSAL

## 2014-09-10 NOTE — Evaluation (Signed)
Physical Therapy Evaluation Patient Details Name: Carmen Dean MRN: [id number] DOB: 06-03-1941 Today's Date: 09/10/2014   History of Present Illness  HPI: Carmen Dean is a 73 y.o. female with a history of COPD, Asthma, and Remote history of Left Breast Cancer S/P Mastectomy  (1998) who presents to the ED with complaints of weakness and poor appetite and confusion for the past 4 days since she was discharged from the hospital in [place] where she had been admitted on 10/07 and treated for a UTI.   She was evaluated in the ED and was found have Atrial Fibrillation with RVR up to a rate of 148.   She was also found to have Hyponatremia with a sodium level of 125.  She denied having any chest pain or palpitations.  She was administered IV Cardizem 10 mg X 1 dose which temporarily improved her rate, and then she was started on and IV Cardizem drip and referred for medical admission.    Clinical Impression   Pt admitted with above. Pt currently with functional limitations due to the deficits listed below (see PT Problem List).  Pt will benefit from skilled PT to increase their independence and safety with mobility to allow discharge to the venue listed below.   Pt ambulated a short distance in room, limited by some nausea (RN aware); HR ranged 83-118, BP 130/47 post short amb      Follow Up Recommendations Home health PT;Supervision/Assistance - 24 hour    Equipment Recommendations  Rolling walker with 5" wheels;3in1 (PT)    Recommendations for Other Services OT consult     Precautions / Restrictions Precautions Precautions: Fall      Mobility  Bed Mobility                  Transfers Overall transfer level: Needs assistance Equipment used: 1 person hand held assist Transfers: Sit to/from Stand Sit to Stand: Mod assist         General transfer comment: Mod assist to power-up to stand and for steadiness  Ambulation/Gait Ambulation/Gait assistance: Min  assist Ambulation Distance (Feet): 20 Feet Assistive device: 1 person hand held assist (and pusing IV pole) Gait Pattern/deviations: Step-through pattern;Decreased step length - right;Decreased step length - left     General Gait Details: Slow, guarded steps, noted heavy dependence on UE support from IV pole for balance; will use RW next time  Stairs            Wheelchair Mobility    Modified Rankin (Stroke Patients Only)       Balance Overall balance assessment: Needs assistance         Standing balance support: Bilateral upper extremity supported Standing balance-Leahy Scale: Poor                               Pertinent Vitals/Pain Pain Assessment: No/denies pain (Nausea; RN notified)    Home Living Family/patient expects to be discharged to:: Private residence Living Arrangements: Spouse/significant other Available Help at Discharge: Family;Available 24 hours/day Type of Home: House Home Access: Stairs to enter Entrance Stairs-Rails: Right Entrance Stairs-Number of Steps: 3 Home Layout: Multi-level Home Equipment: None      Prior Function Level of Independence: Independent               Hand Dominance        Extremity/Trunk Assessment   Upper Extremity Assessment: Generalized weakness;Defer to OT evaluation  Lower Extremity Assessment: Generalized weakness         Communication   Communication: No difficulties  Cognition Arousal/Alertness: Awake/alert Behavior During Therapy: WFL for tasks assessed/performed Overall Cognitive Status: Within Functional Limits for tasks assessed                      General Comments      Exercises        Assessment/Plan    PT Assessment Patient needs continued PT services  PT Diagnosis Difficulty walking;Generalized weakness   PT Problem List Decreased strength;Decreased activity tolerance;Decreased balance;Decreased mobility;Decreased knowledge of use of  DME;Cardiopulmonary status limiting activity  PT Treatment Interventions DME instruction;Gait training;Stair training;Functional mobility training;Therapeutic activities;Therapeutic exercise;Balance training;Patient/family education   PT Goals (Current goals can be found in the Care Plan section) Acute Rehab PT Goals Patient Stated Goal: to feel better PT Goal Formulation: With patient Time For Goal Achievement: 09/24/14 Potential to Achieve Goals: Good    Frequency Min 3X/week   Barriers to discharge        Co-evaluation               End of Session   Activity Tolerance: Patient tolerated treatment well;Other (comment) (though limited by nausea) Patient left: in chair;with call bell/phone within reach;with family/visitor present Nurse Communication: Mobility status         Time: 1347-1410 PT Time Calculation (min): 23 min   Charges:   PT Evaluation $Initial PT Evaluation Tier I: 1 Procedure PT Treatments $Gait Training: 8-22 mins   PT G Codes:          Quin Hoop 09/10/2014, 2:18 PM  Roney [place], [place] Pager [phone] Office [phone]

## 2014-09-10 NOTE — Progress Notes (Signed)
Transferred to 2west room19 by wheelchair, stable, report given to RN.

## 2014-09-10 NOTE — Progress Notes (Signed)
Echocardiogram 2D Echocardiogram has been performed.  Doyle Askew 09/10/2014, 3:02 PM

## 2014-09-10 NOTE — Progress Notes (Signed)
Complained of headache, scale 5/10 tylenol given with relief.

## 2014-09-10 NOTE — Progress Notes (Signed)
[facility] TEAM 1 - Stepdown/ICU TEAM Progress Note  Carmen Dean IWP:[id number] DOB: 10-22-1941 DOA: 08/29/2014 PCP: No primary provider on file.  Admit HPI / Brief Narrative: 73 y.o. female with a history of COPD and a remote history of Left Breast Cancer S/P Mastectomy (1998) who presented to the ED with complaints of weakness, poor appetite, and confusion for 4 days since she was discharged from a hospital in [place] where she had been admitted on 10/07 for a UTI.   In the ED she was found to be in Atrial Fibrillation with RVR up to a rate of 148. She was also found to have Hyponatremia with a sodium level of 125. She denied chest pain or palpitations. She was administered IV Cardizem 10 mg X 1 dose which temporarily improved her rate, and then she was started on and IV Cardizem drip.   HPI/Subjective: Pt feels "achey all over and tired."  She denies cp, n/v, abdom pain, or sob.    Assessment/Plan:  Acute afib w/ RVR  Has been transitioned to oral cardizem - rate well controlled - CHA2DS2-VASc score is 3 > full dose lovenox for now - CM to investigate options for new oral meds prior to transition - PT/OT eval to determine how stable pt is on her feet - TTE pending to r/o valvular abnorm  Hyponatremia Stopped HCTZ - Na improving - follow  UTI Cont empiric abx tx   Toxic metabolic encephalopathy Resolved w/ simple rate control, rehydration, and tx of UTI  Hypokalemia K+ corrected - Mg low normal so will replete x1  Normocytic anemia Check Fe studies - no evidence of acute blood loss so will cont anticoag for now   HTN BP currently reasonably controlled  COPD Well compensated at present   Hx of CAD  Remote hx of L breast CA  Code Status: FULL Family Communication: husband at bedside  Disposition Plan: transfer to tele bed - PT/OT   Consultants: None  Procedures: TTE - pending   Antibiotics: Rocephin 10/16 >  DVT prophylaxis: Full dose  lovenox  Objective: Blood pressure 135/59, pulse 81, temperature 97.4 F (36.3 C), temperature source Oral, resp. rate 17, height 5\' 3"  (1.6 m), weight 47.2 kg (104 lb 0.9 oz), SpO2 100.00%.  Intake/Output Summary (Last 24 hours) at 09/10/14 1041 Last data filed at 09/10/14 1000  Gross per 24 hour  Intake 2034.33 ml  Output      0 ml  Net 2034.33 ml    Exam: General: No acute respiratory distress Lungs: Clear to auscultation bilaterally without wheezes or crackles Cardiovascular: irreg irreg - rate controlled - no appreciable M  Abdomen: Nontender, nondistended, soft, bowel sounds positive, no rebound, no ascites, no appreciable mass Extremities: No significant cyanosis, clubbing, or edema bilateral lower extremities   Data Reviewed: Basic Metabolic Panel:  Recent Labs Lab 09/10/2014 1958 09/09/14 0506 09/10/14 0326  NA 125* 124* 128*  K 3.5* 3.0* 4.9  CL 86* 89* 98  CO2 21 20 19   GLUCOSE 127* 114* 112*  BUN 23 22 16   CREATININE 1.19* 1.09 0.80  CALCIUM 10.1 9.3 8.8  MG  --   --  1.6    Liver Function Tests:  Recent Labs Lab 09/14/2014 1958 09/10/14 0326  AST 29 24  ALT 19 17  ALKPHOS 90 73  BILITOT 0.4 <0.2*  PROT 7.4 5.9*  ALBUMIN 3.5 2.6*    Coags: No results found for this basename: PT, INR,  in the last 168  hours No results found for this basename: PTT,  in the last 168 hours  CBC:  Recent Labs Lab 09/07/2014 1958 09/09/14 0506 09/10/14 0326  WBC 9.0 9.1 10.1  NEUTROABS 6.6  --   --   HGB 13.7 12.8 11.6*  HCT 40.4 36.7 35.9*  MCV 92.2 92.4 95.0  PLT 351 305 335    Recent Results (from the past 240 hour(s))  MRSA PCR SCREENING     Status: None   Collection Time    09/09/14  3:39 PM      Result Value Ref Range Status   MRSA by PCR NEGATIVE  NEGATIVE Final   Comment:            The GeneXpert MRSA Assay (FDA     approved for NASAL specimens     only), is one component of a     comprehensive MRSA colonization     surveillance program. It  is not     intended to diagnose MRSA     infection nor to guide or     monitor treatment for     MRSA infections.     Studies:  Recent x-ray studies have been reviewed in detail by the Attending Physician  Scheduled Meds:  Scheduled Meds: . antiseptic oral rinse  7 mL Mouth Rinse BID  . aspirin EC  81 mg Oral Daily  . atorvastatin  80 mg Oral q1800  . buPROPion  150 mg Oral BID WC  . cefTRIAXone (ROCEPHIN)  IV  1 g Intravenous Q24H  . cholecalciferol  5,000 Units Oral Daily  . diltiazem  30 mg Oral 4 times per day  . enoxaparin (LOVENOX) injection  50 mg Subcutaneous BID  . metoprolol tartrate  12.5 mg Oral BID  . raloxifene  60 mg Oral Daily    Time spent on care of this patient: 35 mins   Inette Doubrava T , MD   [facility] Office  [phone] Pager - Text Page per Shea Evans as per below:  On-Call/Text Page:      [email or web address]      password [email or web address]  If 7PM-7AM, please contact night-coverage [email or web address] Password [email or web address] 09/10/2014, 10:41 AM   LOS: 2 days

## 2014-09-11 ENCOUNTER — Inpatient Hospital Stay (HOSPITAL_COMMUNITY): Payer: Medicare Other

## 2014-09-11 DIAGNOSIS — J9601 Acute respiratory failure with hypoxia: Secondary | ICD-10-CM

## 2014-09-11 LAB — POCT I-STAT 3, ART BLOOD GAS (G3+)
ACID-BASE DEFICIT: 9 mmol/L — AB (ref 0.0–2.0)
Acid-base deficit: 10 mmol/L — ABNORMAL HIGH (ref 0.0–2.0)
BICARBONATE: 19.6 meq/L — AB (ref 20.0–24.0)
Bicarbonate: 21.6 mEq/L (ref 20.0–24.0)
O2 Saturation: 99 %
O2 Saturation: 99 %
PH ART: 7.083 — AB (ref 7.350–7.450)
PO2 ART: 179 mmHg — AB (ref 80.0–100.0)
Patient temperature: 98.6
Patient temperature: 98.6
TCO2: 24 mmol/L (ref 0–100)
TCO2: 21 mmol/L (ref 0–100)
pCO2 arterial: 72.3 mmHg (ref 35.0–45.0)
pCO2 arterial: 50.5 mmHg — ABNORMAL HIGH (ref 35.0–45.0)
pH, Arterial: 7.197 — CL (ref 7.350–7.450)
pO2, Arterial: 200 mmHg — ABNORMAL HIGH (ref 80.0–100.0)

## 2014-09-11 LAB — BLOOD GAS, ARTERIAL
ACID-BASE DEFICIT: 9.7 mmol/L — AB (ref 0.0–2.0)
BICARBONATE: 20 meq/L (ref 20.0–24.0)
Drawn by: 23588
O2 Content: 3 L/min
O2 Saturation: 92.4 %
PATIENT TEMPERATURE: 98.6
TCO2: 22.5 mmol/L (ref 0–100)
pCO2 arterial: 82.5 mmHg (ref 35.0–45.0)
pH, Arterial: 7.013 — CL (ref 7.350–7.450)
pO2, Arterial: 84.9 mmHg (ref 80.0–100.0)

## 2014-09-11 LAB — CBC
HCT: 37.2 % (ref 36.0–46.0)
Hemoglobin: 11.7 g/dL — ABNORMAL LOW (ref 12.0–15.0)
MCH: 30.6 pg (ref 26.0–34.0)
MCHC: 31.5 g/dL (ref 30.0–36.0)
MCV: 97.4 fL (ref 78.0–100.0)
Platelets: 325 10*3/uL (ref 150–400)
RBC: 3.82 MIL/uL — AB (ref 3.87–5.11)
RDW: 14 % (ref 11.5–15.5)
WBC: 8.4 10*3/uL (ref 4.0–10.5)

## 2014-09-11 LAB — VITAMIN B12: VITAMIN B 12: 470 pg/mL (ref 211–911)

## 2014-09-11 LAB — BASIC METABOLIC PANEL
Anion gap: 14 (ref 5–15)
BUN: 11 mg/dL (ref 6–23)
CHLORIDE: 102 meq/L (ref 96–112)
CO2: 16 meq/L — AB (ref 19–32)
Calcium: 8.2 mg/dL — ABNORMAL LOW (ref 8.4–10.5)
Creatinine, Ser: 0.69 mg/dL (ref 0.50–1.10)
GFR calc non Af Amer: 85 mL/min — ABNORMAL LOW (ref >90)
Glucose, Bld: 127 mg/dL — ABNORMAL HIGH (ref 70–99)
POTASSIUM: 4.3 meq/L (ref 3.7–5.3)
Sodium: 132 mEq/L — ABNORMAL LOW (ref 137–147)

## 2014-09-11 LAB — IRON AND TIBC
IRON: 37 ug/dL — AB (ref 42–135)
Saturation Ratios: 13 % — ABNORMAL LOW (ref 20–55)
TIBC: 290 ug/dL (ref 250–470)
UIBC: 253 ug/dL (ref 125–400)

## 2014-09-11 LAB — APTT: aPTT: 37 seconds (ref 24–37)

## 2014-09-11 LAB — PRO B NATRIURETIC PEPTIDE: PRO B NATRI PEPTIDE: 3157 pg/mL — AB (ref 0–125)

## 2014-09-11 LAB — PROTIME-INR
INR: 1.13 (ref 0.00–1.49)
Prothrombin Time: 14.6 seconds (ref 11.6–15.2)

## 2014-09-11 LAB — MRSA PCR SCREENING: MRSA by PCR: NEGATIVE

## 2014-09-11 LAB — RETICULOCYTES
RBC.: 3.78 MIL/uL — ABNORMAL LOW (ref 3.87–5.11)
Retic Count, Absolute: 56.7 10*3/uL (ref 19.0–186.0)
Retic Ct Pct: 1.5 % (ref 0.4–3.1)

## 2014-09-11 LAB — FERRITIN: Ferritin: 80 ng/mL (ref 10–291)

## 2014-09-11 LAB — PROCALCITONIN

## 2014-09-11 LAB — TROPONIN I
Troponin I: <0.3 ng/mL (ref <0.30)
Troponin I: <0.3 ng/mL (ref <0.30)

## 2014-09-11 LAB — MAGNESIUM: MAGNESIUM: 1.9 mg/dL (ref 1.5–2.5)

## 2014-09-11 MED ORDER — CETYLPYRIDINIUM CHLORIDE 0.05 % MT LIQD: 7.0000 mL | Freq: Four times a day (QID) | OROMUCOSAL | Status: DC

## 2014-09-11 MED ORDER — PHENYLEPHRINE HCL 10 MG/ML IJ SOLN
30.0000 ug/min | INTRAVENOUS | Status: DC
  Administered 2014-09-11 (×2): 30 ug/min via INTRAVENOUS
  Filled 2014-09-11 (×2): qty 1

## 2014-09-11 MED ORDER — SUCCINYLCHOLINE CHLORIDE 20 MG/ML IJ SOLN
INTRAMUSCULAR | Status: AC
  Filled 2014-09-11: qty 1

## 2014-09-11 MED ORDER — SODIUM CHLORIDE 0.9 % IV SOLN
0.0000 ug/h | INTRAVENOUS | Status: DC
  Administered 2014-09-11: 200 ug/h via INTRAVENOUS
  Administered 2014-09-12: 300 ug/h via INTRAVENOUS
  Administered 2014-09-12: 350 ug/h via INTRAVENOUS
  Administered 2014-09-12: 300 ug/h via INTRAVENOUS
  Administered 2014-09-13: 175 ug/h via INTRAVENOUS
  Administered 2014-09-14 (×2): 150 ug/h via INTRAVENOUS
  Filled 2014-09-11 (×7): qty 50

## 2014-09-11 MED ORDER — FENTANYL CITRATE 0.05 MG/ML IJ SOLN
50.0000 ug | INTRAMUSCULAR | Status: DC | PRN
  Administered 2014-09-11: 50 ug via INTRAVENOUS

## 2014-09-11 MED ORDER — FENTANYL BOLUS VIA INFUSION
25.0000 ug | INTRAVENOUS | Status: DC | PRN
  Filled 2014-09-11: qty 50

## 2014-09-11 MED ORDER — MIDAZOLAM HCL 2 MG/2ML IJ SOLN
INTRAMUSCULAR | Status: AC
  Administered 2014-09-11: 2 mg
  Filled 2014-09-11: qty 4

## 2014-09-11 MED ORDER — ASPIRIN 81 MG PO CHEW
81.0000 mg | CHEWABLE_TABLET | Freq: Every day | ORAL | Status: DC
  Administered 2014-09-11 – 2014-09-15 (×5): 81 mg
  Filled 2014-09-11 (×5): qty 1

## 2014-09-11 MED ORDER — LIDOCAINE HCL (CARDIAC) 20 MG/ML IV SOLN
INTRAVENOUS | Status: AC
Start: 2014-09-11 — End: 2014-09-12
  Filled 2014-09-11: qty 5

## 2014-09-11 MED ORDER — FENTANYL CITRATE 0.05 MG/ML IJ SOLN
INTRAMUSCULAR | Status: AC
  Administered 2014-09-11: 100 ug
  Filled 2014-09-11: qty 4

## 2014-09-11 MED ORDER — FENTANYL CITRATE 0.05 MG/ML IJ SOLN
50.0000 ug | INTRAMUSCULAR | Status: AC | PRN
  Administered 2014-09-11 (×3): 50 ug via INTRAVENOUS
  Filled 2014-09-11 (×2): qty 2

## 2014-09-11 MED ORDER — ALBUTEROL SULFATE (2.5 MG/3ML) 0.083% IN NEBU
INHALATION_SOLUTION | RESPIRATORY_TRACT | Status: AC
  Administered 2014-09-11: 2.5 mg
  Filled 2014-09-11: qty 3

## 2014-09-11 MED ORDER — FENTANYL CITRATE 0.05 MG/ML IJ SOLN
INTRAMUSCULAR | Status: AC
  Filled 2014-09-11: qty 4

## 2014-09-11 MED ORDER — ROCURONIUM BROMIDE 50 MG/5ML IV SOLN
INTRAVENOUS | Status: AC
  Filled 2014-09-11: qty 2

## 2014-09-11 MED ORDER — VITAMIN D3 25 MCG (1000 UNIT) PO TABS
5000.0000 [IU] | ORAL_TABLET | Freq: Every day | ORAL | Status: DC
  Administered 2014-09-12 – 2014-09-15 (×4): 5000 [IU]
  Filled 2014-09-11 (×4): qty 5

## 2014-09-11 MED ORDER — RALOXIFENE HCL 60 MG PO TABS
60.0000 mg | ORAL_TABLET | Freq: Every day | ORAL | Status: DC
  Administered 2014-09-12: 60 mg
  Filled 2014-09-11: qty 1

## 2014-09-11 MED ORDER — IPRATROPIUM-ALBUTEROL 0.5-2.5 (3) MG/3ML IN SOLN
3.0000 mL | RESPIRATORY_TRACT | Status: DC
  Administered 2014-09-11 – 2014-09-15 (×22): 3 mL via RESPIRATORY_TRACT
  Filled 2014-09-11 (×22): qty 3

## 2014-09-11 MED ORDER — PHENYLEPHRINE HCL 10 MG/ML IJ SOLN
30.0000 ug/min | INTRAMUSCULAR | Status: DC
  Administered 2014-09-11: 50 ug/min via INTRAVENOUS
  Administered 2014-09-12: 170 ug/min via INTRAVENOUS
  Administered 2014-09-12: 70 ug/min via INTRAVENOUS
  Administered 2014-09-13 – 2014-09-15 (×11): 200 ug/min via INTRAVENOUS
  Administered 2014-09-15: 75 ug/min via INTRAVENOUS
  Administered 2014-09-15: 200 ug/min via INTRAVENOUS
  Filled 2014-09-11 (×21): qty 4

## 2014-09-11 MED ORDER — CHLORHEXIDINE GLUCONATE 0.12 % MT SOLN
15.0000 mL | Freq: Two times a day (BID) | OROMUCOSAL | Status: DC
  Administered 2014-09-11 – 2014-09-15 (×8): 15 mL via OROMUCOSAL
  Filled 2014-09-11 (×8): qty 15

## 2014-09-11 MED ORDER — FENTANYL CITRATE 0.05 MG/ML IJ SOLN
25.0000 ug | INTRAMUSCULAR | Status: DC | PRN
  Administered 2014-09-11: 50 ug via INTRAVENOUS
  Filled 2014-09-11: qty 2

## 2014-09-11 MED ORDER — MIDAZOLAM HCL 2 MG/2ML IJ SOLN
INTRAMUSCULAR | Status: AC
  Filled 2014-09-11: qty 4

## 2014-09-11 MED ORDER — DOPAMINE-DEXTROSE 3.2-5 MG/ML-% IV SOLN: 0.0000 ug/kg/min | INTRAVENOUS | Status: DC

## 2014-09-11 MED ORDER — DOPAMINE-DEXTROSE 3.2-5 MG/ML-% IV SOLN
INTRAVENOUS | Status: AC
  Filled 2014-09-11: qty 250

## 2014-09-11 MED ORDER — ATORVASTATIN CALCIUM 80 MG PO TABS
80.0000 mg | ORAL_TABLET | Freq: Every day | ORAL | Status: DC
  Administered 2014-09-11 – 2014-09-14 (×4): 80 mg
  Filled 2014-09-11 (×5): qty 1

## 2014-09-11 MED ORDER — CETYLPYRIDINIUM CHLORIDE 0.05 % MT LIQD
7.0000 mL | Freq: Four times a day (QID) | OROMUCOSAL | Status: DC
  Administered 2014-09-12 – 2014-09-15 (×14): 7 mL via OROMUCOSAL

## 2014-09-11 MED ORDER — FUROSEMIDE 10 MG/ML IJ SOLN
40.0000 mg | Freq: Once | INTRAMUSCULAR | Status: AC
  Administered 2014-09-11: 40 mg via INTRAVENOUS
  Filled 2014-09-11: qty 4

## 2014-09-11 MED ORDER — CHLORHEXIDINE GLUCONATE 0.12 % MT SOLN: 15.0000 mL | Freq: Two times a day (BID) | OROMUCOSAL | Status: DC

## 2014-09-11 MED ORDER — ETOMIDATE 2 MG/ML IV SOLN
INTRAVENOUS | Status: AC
  Administered 2014-09-11: 20 mg
  Filled 2014-09-11: qty 20

## 2014-09-11 MED ORDER — METHYLPREDNISOLONE SODIUM SUCC 40 MG IJ SOLR
40.0000 mg | Freq: Four times a day (QID) | INTRAMUSCULAR | Status: DC
  Administered 2014-09-11 – 2014-09-12 (×4): 40 mg via INTRAVENOUS
  Filled 2014-09-11 (×8): qty 1

## 2014-09-11 MED ORDER — PANTOPRAZOLE SODIUM 40 MG IV SOLR
40.0000 mg | Freq: Every day | INTRAVENOUS | Status: DC
  Administered 2014-09-11 – 2014-09-15 (×5): 40 mg via INTRAVENOUS
  Filled 2014-09-11 (×5): qty 40

## 2014-09-11 MED ORDER — SODIUM CHLORIDE 0.9 % IV BOLUS (SEPSIS)
750.0000 mL | Freq: Once | INTRAVENOUS | Status: AC
  Administered 2014-09-11: 750 mL via INTRAVENOUS

## 2014-09-11 MED ORDER — FENTANYL CITRATE 0.05 MG/ML IJ SOLN: 50.0000 ug | Freq: Once | INTRAMUSCULAR | Status: AC

## 2014-09-11 NOTE — Procedures (Signed)
Intubation Procedure Note DLISA BARNWELL [id number] 1940/12/12  Procedure: Intubation Indications: Respiratory insufficiency  Procedure Details Consent: Risks of procedure as well as the alternatives and risks of each were explained to the (patient/caregiver).  Consent for procedure obtained. Time Out: Verified patient identification, verified procedure, site/side was marked, verified correct patient position, special equipment/implants available, medications/allergies/relevent history reviewed, required imaging and test results available.  Performed  Maximum sterile technique was used including gloves, hand hygiene and mask.  MAC and 3  2mg  versed, 100 mcg fentanyl & 20 mg etomidate given in divided doses.  Evaluation Hemodynamic Status: Transient hypotension treated with fluid; O2 sats: stable throughout Patient's Current Condition: stable Complications: No apparent complications Patient did tolerate procedure well. Chest X-ray ordered to verify placement.  CXR: tube position acceptable. Procedure performed by NP Aleda Grana under my direct supervision  Derrill Bagnell V. 09/11/2014

## 2014-09-11 NOTE — Progress Notes (Signed)
Pt husband call to Nursing station stating that his wife was SOB pt was sitting up in bed did not appear to to be in respiratory distress able to carry on full sentence but made a statement I can't go on any further when I asked her to explain she states that she can't get anymore air vital sign done and charted in epic O2 stats were 100% on 2L with 20 respiration lung decreased at bases. RT was called to give breathing treatment will continue to monitor. Arthor Captain LPN

## 2014-09-11 NOTE — Clinical Documentation Improvement (Signed)
Please specify clinical condition of   . Adult BMI =18.5 per nursing staff documentation flowsheets    - . Document any associated diagnoses/conditions, such as morbid obesity, malnutrition, etc. . Supporting Information:   Thank You,   Philippa Chester ,RN Clinical Documentation Specialist:  [facility]

## 2014-09-11 NOTE — Progress Notes (Signed)
Progress Note  Late entry for the note patient seen about 10:30 in the morning initially and back again about 12:50 PM  Carmen Dean UQJ:[id number] DOB: Sep 05, 1941 DOA: 08/29/2014 PCP: No primary provider on file.  Admit HPI / Brief Narrative: 73 y.o. female with a history of COPD and a remote history of Left Breast Cancer S/P Mastectomy (1998) who presented to the ED with complaints of weakness, poor appetite, and confusion for 4 days since she was discharged from a hospital in [place] where she had been admitted on 10/07 for a UTI.  In the ED she was found to be in Atrial Fibrillation with RVR up to a rate of 148. She was also found to have Hyponatremia with a sodium level of 125. She denied chest pain or palpitations. She was administered IV Cardizem 10 mg X 1 dose which temporarily improved her rate, and then she was started on and IV Cardizem drip.   HPI/Subjective: Patient was a sleepy, reported initially that she was not sleeping all day. But was difficult to arouse. ABG obtained and showed acidosis and hypercapnia.  Assessment/Plan:   Acute hypercapnic respiratory failure Patient transferred to the ICU. BiPAP tried initially without significant change in pH, patient subsequently intubated and mechanically ventilated. Likely this is secondary to COPD exacerbation. No evidence of fluid overload. Patient almost had silent chest when I saw her, unable to complete a sentence (no pedal blockers administered). Acute PE is less likely with hypercapnia and lack of hypoxia.  Acute afib w/ RVR  Has been transitioned to oral cardizem - rate well controlled - CHA2DS2-VASc score is 3 > full dose lovenox for now. Continue Lovenox at therapeutic dose  Hyponatremia Stopped HCTZ - Na improving - follow  UTI Cont empiric abx tx   Toxic metabolic encephalopathy Resolved w/ simple rate control, rehydration, and tx of UTI  Hypokalemia K+ corrected - Mg low normal so will replete  x1  Normocytic anemia Check Fe studies - no evidence of acute blood loss so will cont anticoag for now   HTN BP currently reasonably controlled  COPD Well compensated at present   Hx of CAD  Remote hx of L breast CA  Code Status: FULL Family Communication: husband at bedside  Disposition Plan: transfer to tele bed - PT/OT   Consultants: None  Procedures: TTE - pending   Antibiotics: Rocephin 10/16 >  DVT prophylaxis: Full dose lovenox  Objective: Blood pressure 106/24, pulse 59, temperature 98.4 F (36.9 C), temperature source Oral, resp. rate 11, height 5\' 3"  (1.6 m), weight 47.2 kg (104 lb 0.9 oz), SpO2 100.00%.  Intake/Output Summary (Last 24 hours) at 09/11/14 1630 Last data filed at 09/11/14 1600  Gross per 24 hour  Intake 2161.5 ml  Output    680 ml  Net 1481.5 ml    Exam: General: No acute respiratory distress Lungs: Clear to auscultation bilaterally without wheezes or crackles Cardiovascular: irreg irreg - rate controlled - no appreciable M  Abdomen: Nontender, nondistended, soft, bowel sounds positive, no rebound, no ascites, no appreciable mass Extremities: No significant cyanosis, clubbing, or edema bilateral lower extremities   Data Reviewed: Basic Metabolic Panel:  Recent Labs Lab 09/23/2014 1958 09/09/14 0506 09/10/14 0326 09/11/14 0417  NA 125* 124* 128* 132*  K 3.5* 3.0* 4.9 4.3  CL 86* 89* 98 102  CO2 21 20 19  16*  GLUCOSE 127* 114* 112* 127*  BUN 23 22 16 11   CREATININE 1.19* 1.09 0.80 0.69  CALCIUM  10.1 9.3 8.8 8.2*  MG  --   --  1.6 1.9    Liver Function Tests:  Recent Labs Lab 09/23/2014 1958 09/10/14 0326  AST 29 24  ALT 19 17  ALKPHOS 90 73  BILITOT 0.4 <0.2*  PROT 7.4 5.9*  ALBUMIN 3.5 2.6*    Coags:  Recent Labs Lab 09/11/14 0417  INR 1.13   No results found for this basename: PTT,  in the last 168 hours  CBC:  Recent Labs Lab 09/17/2014 1958 09/09/14 0506 09/10/14 0326 09/11/14 0417  WBC 9.0 9.1  10.1 8.4  NEUTROABS 6.6  --   --   --   HGB 13.7 12.8 11.6* 11.7*  HCT 40.4 36.7 35.9* 37.2  MCV 92.2 92.4 95.0 97.4  PLT 351 305 335 325    Recent Results (from the past 240 hour(s))  URINE CULTURE     Status: None   Collection Time    09/06/2014  9:28 PM      Result Value Ref Range Status   Specimen Description URINE, RANDOM   Final   Special Requests NONE   Final   Culture  Setup Time     Final   Value: 09/09/2014 15:23     Performed at [facility]     Final   Value: 9,000 COLONIES/ML     Performed at [facility]   Culture     Final   Value: INSIGNIFICANT GROWTH     Performed at [facility]   Report Status 09/10/2014 FINAL   Final  MRSA PCR SCREENING     Status: None   Collection Time    09/09/14  3:39 PM      Result Value Ref Range Status   MRSA by PCR NEGATIVE  NEGATIVE Final   Comment:            The GeneXpert MRSA Assay (FDA     approved for NASAL specimens     only), is one component of a     comprehensive MRSA colonization     surveillance program. It is not     intended to diagnose MRSA     infection nor to guide or     monitor treatment for     MRSA infections.  MRSA PCR SCREENING     Status: None   Collection Time    09/11/14  1:26 PM      Result Value Ref Range Status   MRSA by PCR NEGATIVE  NEGATIVE Final   Comment:            The GeneXpert MRSA Assay (FDA     approved for NASAL specimens     only), is one component of a     comprehensive MRSA colonization     surveillance program. It is not     intended to diagnose MRSA     infection nor to guide or     monitor treatment for     MRSA infections.     Studies:  Recent x-ray studies have been reviewed in detail by the Attending Physician  Scheduled Meds:  Scheduled Meds: . [START ON 09/12/2014] antiseptic oral rinse  7 mL Mouth Rinse QID  . [START ON 09/12/2014] antiseptic oral rinse  7 mL Mouth Rinse QID  . aspirin  81 mg Per Tube Daily  .  atorvastatin  80 mg Per Tube q1800  . cefTRIAXone (ROCEPHIN)  IV  1 g Intravenous Q24H  .  chlorhexidine  15 mL Mouth Rinse BID  . chlorhexidine  15 mL Mouth Rinse BID  . [START ON 09/12/2014] cholecalciferol  5,000 Units Per Tube Daily  . DOPamine      . enoxaparin (LOVENOX) injection  50 mg Subcutaneous BID  . ipratropium-albuterol  3 mL Nebulization Q4H  . lidocaine (cardiac) 100 mg/63ml      . methylPREDNISolone (SOLU-MEDROL) injection  40 mg Intravenous Q6H  . pantoprazole (PROTONIX) IV  40 mg Intravenous Daily  . [START ON 09/12/2014] raloxifene  60 mg Per Tube Daily  . rocuronium      . succinylcholine        Time spent on care of this patient: 35 mins   Birdie Hopes , MD   [facility] Office  [phone] Pager - Text Page per Shea Evans as per below:  On-Call/Text Page:      [email or web address]      password [email or web address]  If 7PM-7AM, please contact night-coverage [email or web address] Password [email or web address] 09/11/2014, 4:30 PM   LOS: 3 days

## 2014-09-11 NOTE — Significant Event (Signed)
Rapid Response Event Note  Overview: Time Called: 9326 Arrival Time: 1238 Event Type: Respiratory  Initial Focused Assessment: Per staff patient has been a little labored today.  She has recently been OOB  At which time she had a sudden decline in respiratory status.  Desat to 66s. Upon my arrival patient lying in bed.  Increased WOB, using accessory muscles, poor air movement. O2 sat 97% on [place] Lung sounds clear upper 1/4, diminished through out Unable to speak sentences or cough on command. She is also very pale  Interventions: ABG done resp tx Dr Hartford Poli at bedside, CCM consulted Transported to [facility] via bed with O2 and heart monitor.   Event Summary: Name of Physician Notified: Elmahi at 38  Name of Consulting Physician Notified: Dr Elsworth Soho at 1300  Outcome: Transferred (Comment) [phone])  Event End Time: [place]  Raliegh Ip

## 2014-09-11 NOTE — Progress Notes (Signed)
[facility] Progress Note Patient Name: EVOLA HOLLIS DOB: 1941-02-23 MRN: [id number]   Date of Service  09/11/2014  HPI/Events of Note    eICU Interventions  Agitation on intermittent sedation. Ordered fentanyl gtt thru PAD2     Intervention Category Major Interventions: Delirium, psychosis, severe agitation - evaluation and management  Paytan Recine S. 09/11/2014, 5:51 PM

## 2014-09-11 NOTE — Progress Notes (Signed)
PULMONARY / CRITICAL CARE MEDICINE   Name: Carmen Dean MRN: [id number] DOB: [birth year]    ADMISSION DATE:  09/18/2014 CONSULTATION DATE:  10/19  REFERRING MD :  [facility] CHIEF COMPLAINT:  Hypercarbic Respiratory failure    HISTORY OF PRESENT ILLNESS:   73 yo with a history of COPD and asthma who was admitted on 10/16 for A fib with RVR, UTI, hyponatremia and encephalopathy. These had all improved as of 10/18. On 10/19 she had increasing WOB and discomfort and placed on biPAP for hypercapnic respiratory failure. Failed biPAP and intubated.    STUDIES:  Echo 10/19: Left ventricle: The cavity size was normal. Wall thickness was normal. Systolic function was normal. The estimated ejection fraction was in the range of 60% to 65%. Wall motion was normal there were no regional wall motion abnormalities. Left ventricular diastolic function parameters were normal.Aortic valve: There was trivial regurgitation.Mitral valve: There was mild regurgitation.Left atrium: The atrium was mildly dilated.Tricuspid valve: There was moderate regurgitation.  SIGNIFICANT EVENTS: 10/19: intubated   Initial Presentation  73 y.o. female with a history of COPD (quit smoking 4 years prior) and a remote history of Left Breast Cancer S/P Mastectomy (1998) who presented to the ED 10/16 with complaints of weakness, poor appetite, and confusion for 4 days since she was discharged from a hospital in [place] where she had been admitted on 10/07 for a UTI. In the ED she was found to be in Atrial Fibrillation with RVR up to a rate of 148. She was also found to have Hyponatremia with a sodium level of 125.  She was administered IV Cardizem and then she was started on and IV Cardizem drip. Since 10/17 has been converted to NSR and oral cardizem with good rate controlled. On 10/19 at 1am patient complained of SOB, [place] increased for comfort. 10/19 at 1pm, patient placed on biPAP after gas results showed respiratory acidosis.  Post biPAP gas showed continued hypercarbic respiratory failure. Patient intubated for failed biPAP trial and WOB.    PAST MEDICAL HISTORY :   has a past medical history of Hypertension; Cancer; COPD (chronic obstructive pulmonary disease); Anginal pain; and Dementia.  has past surgical history that includes Mastectomy. Prior to Admission medications   Medication Sig Start Date End Date Taking? Authorizing Provider  albuterol (PROVENTIL HFA;VENTOLIN HFA) 108 (90 BASE) MCG/ACT inhaler Inhale 2 puffs into the lungs every 6 (six) hours as needed for wheezing or shortness of breath.   Yes Historical Provider, MD  ALPRAZolam Duanne Moron) 1 MG tablet Take 1 mg by mouth 4 (four) times daily as needed for anxiety.   Yes Historical Provider, MD  aspirin EC 81 MG tablet Take 81 mg by mouth daily.   Yes Historical Provider, MD  atorvastatin (LIPITOR) 80 MG tablet Take 80 mg by mouth daily.   Yes Historical Provider, MD  buPROPion (WELLBUTRIN SR) 150 MG 12 hr tablet Take 150 mg by mouth 2 (two) times daily.   Yes Historical Provider, MD  Cholecalciferol (VITAMIN D-3) 5000 UNITS TABS Take 5,000 Units by mouth daily.   Yes Historical Provider, MD  hydrochlorothiazide (HYDRODIURIL) 25 MG tablet Take 25 mg by mouth daily.   Yes Historical Provider, MD  Ipratropium-Albuterol (COMBIVENT RESPIMAT) 20-100 MCG/ACT AERS respimat Inhale 1 puff into the lungs every 6 (six) hours as needed for wheezing.   Yes Historical Provider, MD  metoprolol tartrate (LOPRESSOR) 25 MG tablet Take 12.5 mg by mouth 2 (two) times daily.   Yes Historical Provider, MD  Propylene Glycol (SYSTANE BALANCE OP) Place 2 drops into both eyes 2 (two) times daily as needed (dry eyes).   Yes Historical Provider, MD  raloxifene (EVISTA) 60 MG tablet Take 60 mg by mouth daily.   Yes Historical Provider, MD   No Known Allergies  FAMILY HISTORY:  has no family status information on file.  SOCIAL HISTORY:  reports that she has never smoked. She does not  have any smokeless tobacco history on file. She reports that she drinks alcohol. She reports that she does not use illicit drugs.  REVIEW OF SYSTEMS:  Deferred for neuro status  SUBJECTIVE:  No sedated on vent  VITAL SIGNS: Temp:  [97.4 F (36.3 C)-98.7 F (37.1 C)] 97.9 F (36.6 C) (10/19 1330) Pulse Rate:  [64-80] 64 (10/19 1400) Resp:  [15-23] 22 (10/19 1400) BP: (106-172)/(43-57) 157/48 mmHg (10/19 1430) SpO2:  [70 %-100 %] 100 % (10/19 1400) FiO2 (%):  [60 %] 60 % (10/19 1330) HEMODYNAMICS:   VENTILATOR SETTINGS: Vent Mode:  [-]  FiO2 (%):  [60 %] 60 % INTAKE / OUTPUT:  Intake/Output Summary (Last 24 hours) at 09/11/14 1451 Last data filed at 09/11/14 1400  Gross per 24 hour  Intake   1545 ml  Output    680 ml  Net    865 ml    PHYSICAL EXAMINATION: General:  Chronically ill appearing woman Neuro: Vented and sedated HEENT:  Dry mucous membranes Cardiovascular:  RRR Lungs:  Diminished throughout, prolonged expiratory w/ faint prolonged exp wheeze better heard after intubation , peak pr 40s Abdomen:   Nontender, nondistended, soft +BS Musculoskeletal:  No cyanosis/edema Skin:  Cool and dry  LABS:  CBC  Recent Labs Lab 09/09/14 0506 09/10/14 0326 09/11/14 0417  WBC 9.1 10.1 8.4  HGB 12.8 11.6* 11.7*  HCT 36.7 35.9* 37.2  PLT 305 335 325   Coag's  Recent Labs Lab 09/11/14 0417  APTT 37  INR 1.13   BMET  Recent Labs Lab 09/09/14 0506 09/10/14 0326 09/11/14 0417  NA 124* 128* 132*  K 3.0* 4.9 4.3  CL 89* 98 102  CO2 20 19 16*  BUN 22 16 11   CREATININE 1.09 0.80 0.69  GLUCOSE 114* 112* 127*   Electrolytes  Recent Labs Lab 09/09/14 0506 09/10/14 0326 09/11/14 0417  CALCIUM 9.3 8.8 8.2*  MG  --  1.6 1.9   Sepsis Markers No results found for this basename: LATICACIDVEN, PROCALCITON, O2SATVEN,  in the last 168 hours ABG  Recent Labs Lab 09/11/14 1251 09/11/14 1411  PHART 7.013* 7.083*  PCO2ART 82.5* 72.3*  PO2ART 84.9 179.0*    Liver Enzymes  Recent Labs Lab 09/20/2014 1958 09/10/14 0326  AST 29 24  ALT 19 17  ALKPHOS 90 73  BILITOT 0.4 <0.2*  ALBUMIN 3.5 2.6*   Cardiac Enzymes No results found for this basename: TROPONINI, PROBNP,  in the last 168 hours Glucose No results found for this basename: GLUCAP,  in the last 168 hours  Imaging No results found.   ASSESSMENT / PLAN:  PULMONARY OETT 10/19>>> A: Acute hypercarbic respiratory failure most likely in the setting of AECOPD - no clear cause of worsening but high peak pressures post intubation suggests bspasm Tobacco abuse CXR shows bibasilar airspace disease. PNA vs atelectasis?  P:   Full vent support PCT level PAD protocol (see neuro section) Scheduled albuterol/ipatropium with prn albuterol Solumedrol 40 q 12 Daily SBT as indicated   CARDIOVASCULAR CV A:  Acute afib with RVR (resolved) PMH  HTN Hypotensive following intubation (likely medication related) EF normal P:  Has been transitioned to oral cardizem for RVR-->d/c given hypotension  Neo gtt MAP >65 Cycle CEs  RENAL A:   Hyponatremia- improving after stopping HCTZ Metabolic acidosis; both AG and NAG component. Not clear where NAG component coming from?  UTI P:   See ID Trend BMET; replace electrolytes as indicated-->could consider bicarb replacement if metabolic acidosis persists.  F/u abg Keep even volume status Foley catheter  GASTROINTESTINAL A:   No acute issues P:   OG tube Start tube feedings per nutrition consult SUP: PPI   HEMATOLOGIC A:   Normocytic anemia- no evidence of acute blood loss Iron deficient anemia P:  Iron DVT prophylaxis: lovenox (per cards)  INFECTIOUS A:  Afebrile Recent UTI R/o pna vs bronchitis  P:   Sputum 10/19>>> Abx: Rocephin, start date 10/16 day 3/x Abx: Azithromycin, start 10/19 day 1/x Culture if >38.3C  ENDOCRINE A:  On steroids, anticipate hyperglycemia  P:   ICU hyperglycemia protocol  NEUROLOGIC A:   Toxic metabolic encephalopathy- resolved  P:   RASS goal: -2 Int Fentanyl for RASS goal: -2 , if needed can progress to fent gtt PAD protocol  WUA daily/ SBT daily  Supportive care    Family updated: 10/19 husband updated at bedside re: plan of care.    Interdisciplinary Family Meeting: none   TODAY'S SUMMARY: Intubated. Will continue to treat for COPD exacerbation.   I have personally obtained a history, examined the patient, evaluated laboratory and imaging results, formulated the assessment and plan and placed orders. Initially tried Bipap & even though Abg improved mildly, mental status remained poor, hence intubated, dropped BP post intubation , improved with fluids , 'double clutching' noted on vent even with deep sedation, tried other modes (PCV) but unable to eradicate, do not think she needs paralytic as long as can ventilate.Avoid autoPEEP by keeping RR low.  CRITICAL CARE: The patient is critically ill with multiple organ systems failure and requires high complexity decision making for assessment and support, frequent evaluation and titration of therapies, application of advanced monitoring technologies and extensive interpretation of multiple databases. Critical Care Time devoted to patient care services described in this note is 60 minutes.   Kara Mead MD. Shade Flood. [facility] Pager [phone] If no response call [phone]    09/11/2014, 2:51 PM

## 2014-09-11 NOTE — Progress Notes (Signed)
OT Cancellation Note  Patient Details Name: Carmen Dean MRN: [id number] DOB: November 26, 1940   Cancelled Treatment:    Reason Eval/Treat Not Completed: Medical issues which prohibited therapy - Pt with respiratory distress and transferred to ICU.  Will check back tomorrow and initiate OT as medically appropriate.  Darlina Rumpf [place], OTR/L [phone]  09/11/2014, 3:01 PM

## 2014-09-12 ENCOUNTER — Inpatient Hospital Stay (HOSPITAL_COMMUNITY): Payer: Medicare Other

## 2014-09-12 ENCOUNTER — Encounter (HOSPITAL_COMMUNITY): Payer: Self-pay | Admitting: Radiology

## 2014-09-12 LAB — BLOOD GAS, ARTERIAL
Acid-base deficit: 9.2 mmol/L — ABNORMAL HIGH (ref 0.0–2.0)
Acid-base deficit: 9.6 mmol/L — ABNORMAL HIGH (ref 0.0–2.0)
Bicarbonate: 18 mEq/L — ABNORMAL LOW (ref 20.0–24.0)
Bicarbonate: 17.8 mEq/L — ABNORMAL LOW (ref 20.0–24.0)
DRAWN BY: 308601
Drawn by: 308601
FIO2: 0.4 %
FIO2: 0.4 %
MECHVT: 420 mL
O2 SAT: 93.1 %
O2 Saturation: 92.9 %
PCO2 ART: 52.5 mmHg — AB (ref 35.0–45.0)
PCO2 ART: 53.7 mmHg — AB (ref 35.0–45.0)
PEEP: 5 cmH2O
PEEP: 5 cmH2O
PH ART: 7.146 — AB (ref 7.350–7.450)
Patient temperature: 98.6
Patient temperature: 98.6
RATE: 15 resp/min
RATE: 18 resp/min
TCO2: 19.6 mmol/L (ref 0–100)
TCO2: 19.4 mmol/L (ref 0–100)
VT: 500 mL
pH, Arterial: 7.161 — CL (ref 7.350–7.450)
pO2, Arterial: 74.1 mmHg — ABNORMAL LOW (ref 80.0–100.0)
pO2, Arterial: 75.8 mmHg — ABNORMAL LOW (ref 80.0–100.0)

## 2014-09-12 LAB — GLUCOSE, CAPILLARY
GLUCOSE-CAPILLARY: 131 mg/dL — AB (ref 70–99)
GLUCOSE-CAPILLARY: 143 mg/dL — AB (ref 70–99)
Glucose-Capillary: 113 mg/dL — ABNORMAL HIGH (ref 70–99)
Glucose-Capillary: 190 mg/dL — ABNORMAL HIGH (ref 70–99)

## 2014-09-12 LAB — CBC
HCT: 34.9 % — ABNORMAL LOW (ref 36.0–46.0)
Hemoglobin: 11.1 g/dL — ABNORMAL LOW (ref 12.0–15.0)
MCH: 31 pg (ref 26.0–34.0)
MCHC: 31.8 g/dL (ref 30.0–36.0)
MCV: 97.5 fL (ref 78.0–100.0)
PLATELETS: 394 10*3/uL (ref 150–400)
RBC: 3.58 MIL/uL — AB (ref 3.87–5.11)
RDW: 13.9 % (ref 11.5–15.5)
WBC: 10 10*3/uL (ref 4.0–10.5)

## 2014-09-12 LAB — PROCALCITONIN: Procalcitonin: <0.1 ng/mL

## 2014-09-12 LAB — BASIC METABOLIC PANEL
Anion gap: 16 — ABNORMAL HIGH (ref 5–15)
BUN: 16 mg/dL (ref 6–23)
CO2: 16 mEq/L — ABNORMAL LOW (ref 19–32)
CREATININE: 0.98 mg/dL (ref 0.50–1.10)
Calcium: 8.4 mg/dL (ref 8.4–10.5)
Chloride: 98 mEq/L (ref 96–112)
GFR calc Af Amer: 65 mL/min — ABNORMAL LOW (ref >90)
GFR, EST NON AFRICAN AMERICAN: 56 mL/min — AB (ref >90)
GLUCOSE: 114 mg/dL — AB (ref 70–99)
Potassium: 4.6 mEq/L (ref 3.7–5.3)
SODIUM: 130 meq/L — AB (ref 137–147)

## 2014-09-12 LAB — TROPONIN I

## 2014-09-12 LAB — CHLORIDE, URINE, RANDOM

## 2014-09-12 LAB — LACTIC ACID, PLASMA: LACTIC ACID, VENOUS: 1.9 mmol/L (ref 0.5–2.2)

## 2014-09-12 LAB — NA AND K (SODIUM & POTASSIUM), RAND UR
Potassium Urine: 18 mEq/L
Sodium, Ur: <20 mEq/L

## 2014-09-12 MED ORDER — IOHEXOL 350 MG/ML SOLN
100.0000 mL | Freq: Once | INTRAVENOUS | Status: AC | PRN
  Administered 2014-09-12: 100 mL via INTRAVENOUS

## 2014-09-12 MED ORDER — DILTIAZEM HCL 100 MG IV SOLR
5.0000 mg/h | INTRAVENOUS | Status: DC
  Administered 2014-09-12: 10 mg/h via INTRAVENOUS
  Administered 2014-09-12: 5 mg/h via INTRAVENOUS
  Filled 2014-09-12: qty 100

## 2014-09-12 MED ORDER — MIDAZOLAM HCL 2 MG/2ML IJ SOLN
1.0000 mg | INTRAMUSCULAR | Status: DC | PRN
  Administered 2014-09-12 – 2014-09-14 (×10): 2 mg via INTRAVENOUS
  Filled 2014-09-12 (×8): qty 2

## 2014-09-12 MED ORDER — SODIUM BICARBONATE 8.4 % IV SOLN
INTRAVENOUS | Status: DC
  Administered 2014-09-12 – 2014-09-14 (×3): via INTRAVENOUS
  Filled 2014-09-12 (×9): qty 150

## 2014-09-12 MED ORDER — ADULT MULTIVITAMIN LIQUID CH
5.0000 mL | Freq: Every day | ORAL | Status: DC
  Administered 2014-09-12 – 2014-09-15 (×4): 5 mL
  Filled 2014-09-12 (×4): qty 5

## 2014-09-12 MED ORDER — VITAL AF 1.2 CAL PO LIQD
1000.0000 mL | ORAL | Status: DC
  Administered 2014-09-12 – 2014-09-14 (×2): 1000 mL
  Filled 2014-09-12 (×4): qty 1000

## 2014-09-12 MED ORDER — SACCHAROMYCES BOULARDII 250 MG PO CAPS
250.0000 mg | ORAL_CAPSULE | Freq: Two times a day (BID) | ORAL | Status: DC
  Administered 2014-09-12 – 2014-09-15 (×6): 250 mg via ORAL
  Filled 2014-09-12 (×8): qty 1

## 2014-09-12 MED ORDER — METHYLPREDNISOLONE SODIUM SUCC 40 MG IJ SOLR
40.0000 mg | Freq: Two times a day (BID) | INTRAMUSCULAR | Status: DC
  Administered 2014-09-12: 40 mg via INTRAVENOUS
  Filled 2014-09-12 (×4): qty 1

## 2014-09-12 MED ORDER — VITAL HIGH PROTEIN PO LIQD
1000.0000 mL | ORAL | Status: DC
Start: 2014-09-12 — End: 2014-09-12
  Filled 2014-09-12 (×2): qty 1000

## 2014-09-12 MED ORDER — VASOPRESSIN 20 UNIT/ML IJ SOLN
0.0100 [IU]/min | INTRAVENOUS | Status: DC
  Administered 2014-09-12 – 2014-09-14 (×3): 0.03 [IU]/min via INTRAVENOUS
  Filled 2014-09-12 (×5): qty 2

## 2014-09-12 MED ORDER — BIOGAIA PROBIOTIC PO LIQD: 10.0000 mL | Freq: Every day | ORAL | Status: DC

## 2014-09-12 MED ORDER — SODIUM CHLORIDE 0.9 % IV SOLN
INTRAVENOUS | Status: DC
  Administered 2014-09-12 – 2014-09-13 (×2): via INTRAVENOUS

## 2014-09-12 MED ORDER — INSULIN ASPART 100 UNIT/ML ~~LOC~~ SOLN
2.0000 [IU] | SUBCUTANEOUS | Status: DC
  Administered 2014-09-12: 2 [IU] via SUBCUTANEOUS
  Administered 2014-09-12 – 2014-09-13 (×4): 4 [IU] via SUBCUTANEOUS
  Administered 2014-09-13: 6 [IU] via SUBCUTANEOUS
  Administered 2014-09-13 – 2014-09-14 (×4): 4 [IU] via SUBCUTANEOUS
  Administered 2014-09-14: 6 [IU] via SUBCUTANEOUS
  Administered 2014-09-14 (×2): 2 [IU] via SUBCUTANEOUS
  Administered 2014-09-14: 4 [IU] via SUBCUTANEOUS

## 2014-09-12 MED ORDER — DILTIAZEM LOAD VIA INFUSION
10.0000 mg | Freq: Once | INTRAVENOUS | Status: DC
  Filled 2014-09-12: qty 10

## 2014-09-12 NOTE — Progress Notes (Signed)
OT Cancellation Note  Patient Details Name: Carmen Dean MRN: [id number] DOB: Nov 15, 1941   Cancelled Treatment:    Reason Eval/Treat Not Completed: Medical issues which prohibited therapy (Pt intubated. Will continue to follow.)  Malka So 09/12/2014, 8:27 AM

## 2014-09-12 NOTE — Progress Notes (Signed)
RT attempted Arterial Line twice by 2 different RT's, unsuccessfully. MD and RN aware.

## 2014-09-12 NOTE — Procedures (Signed)
Central Venous Catheter Insertion Procedure Note CHERRELLE PLANTE [id number] 1941/05/22  Procedure: Insertion of Central Venous Catheter Indications: Assessment of intravascular volume, Drug and/or fluid administration and Frequent blood sampling  Procedure Details Consent: Risks of procedure as well as the alternatives and risks of each were explained to the (patient/caregiver).  Consent for procedure obtained. Time Out: Verified patient identification, verified procedure, site/side was marked, verified correct patient position, special equipment/implants available, medications/allergies/relevent history reviewed, required imaging and test results available.  Performed  Maximum sterile technique was used including antiseptics, cap, gloves, gown, hand hygiene, mask and sheet. Skin prep: Chlorhexidine; local anesthetic administered A antimicrobial bonded/coated triple lumen catheter was placed in the right internal jugular vein to 15 cm  using the Seldinger technique, suture x3.  Evaluation Blood flow good Complications: No apparent complications Patient did tolerate procedure well. Chest X-ray ordered to verify placement.  CXR: pending.  Procedure performed under direct supervision of Dr. Lake Bells and with ultrasound guidance for direct vessel visualization.    Dala Dock 09/12/2014, 3:32 PM  Attending: This procedure was performed under my direct supervision Roselie Awkward, MD [facility] Pager: [phone] Cell: [phone] If no response, call [phone]

## 2014-09-12 NOTE — Progress Notes (Signed)
Physical Therapy Discharge Patient Details Name: Carmen Dean MRN: [id number] DOB: 03/16/1941 Today's Date: 09/12/2014 Time:  -     Patient discharged from PT services secondary to medical decline - will need to re-order PT to resume therapy services.  Please see latest therapy progress note for current level of functioning and progress toward goals.    Progress and discharge plan discussed with patient and/or caregiver: Patient unable to participate in discharge planning and no caregivers available  GP     [facility] 09/12/2014, 1:15 PM

## 2014-09-12 NOTE — Progress Notes (Signed)
INITIAL NUTRITION ASSESSMENT  DOCUMENTATION CODES Per approved criteria  -Underweight   INTERVENTION: Initiate Vital AF 1.2 @ 20 ml/hr via OG tube and increase by 10 ml every 4 hours to goal rate of 40 ml/hr.   Liquid MVI daily  Tube feeding regimen provides 1152 kcal (96% of needs), 72 grams of protein, and 778 ml of H2O.   NUTRITION DIAGNOSIS: Inadequate oral intake related to inability to eat as evidenced by NPO status  Goal: Pt to meet >/= 90% of their estimated nutrition needs   Monitor:  Respiratory status, TF initiation and tolerance, weight trends, labs  Reason for Assessment: Consult received to initiate and manage enteral nutrition support.  73 y.o. female  Admitting Dx: Atrial fibrillation with RVR  ASSESSMENT: Pt with hx of COPD and asthma admitted on 10/16 for Afib with RVR, UTI. Pt had increased WOB on 10/19, failed biPAP and was intubated.   Patient is currently intubated on ventilator support MV: 7 L/min Temp (24hrs), Avg:98.6 F (37 C), Min:97.7 F (36.5 C), Max:99.6 F (37.6 C)  Propofol: none  No signs of fat or muscle depletion on exam.  Spoke with brother and husband. Pt has had a poor appetite only eating bites of food PTA. This has been going on for a few weeks. Pt has lost about 4 lb in the last month due to poor appetite. Pt was recently hospitalized in [place] for UTI.  Pt does not meet criteria for malnutrition at this time but is at high risk due to weight loss and poor appetite PTA.   Height: Ht Readings from Last 1 Encounters:  09/09/14 5\' 3"  (1.6 m)    Weight: Wt Readings from Last 1 Encounters:  09/09/14 104 lb 0.9 oz (47.2 kg)    Ideal Body Weight: 52.2 kg   % Ideal Body Weight: 90%  Wt Readings from Last 10 Encounters:  09/09/14 104 lb 0.9 oz (47.2 kg)    Usual Body Weight: 108 lb  % Usual Body Weight: 96%  BMI:  Body mass index is 18.44 kg/(m^2).  Estimated Nutritional Needs: Kcal: 1202 Protein: 60-75 grams   Fluid: >1.5 L/day  Skin: abrasion, skin tear, and ecchymosis  Diet Order: NPO  EDUCATION NEEDS: -No education needs identified at this time   Intake/Output Summary (Last 24 hours) at 09/12/14 1454 Last data filed at 09/12/14 1300  Gross per 24 hour  Intake 2309.68 ml  Output    830 ml  Net 1479.68 ml    Last BM: 10/18   Labs:   Recent Labs Lab 09/09/14 0506 09/10/14 0326 09/11/14 0417 09/12/14 0410  NA 124* 128* 132* 130*  K 3.0* 4.9 4.3 4.6  CL 89* 98 102 98  CO2 20 19 16* 16*  BUN 22 16 11 16   CREATININE 1.09 0.80 0.69 0.98  CALCIUM 9.3 8.8 8.2* 8.4  MG  --  1.6 1.9  --   GLUCOSE 114* 112* 127* 114*    CBG (last 3)   Recent Labs  09/12/14 0413 09/12/14 1214  GLUCAP 113* 131*    Scheduled Meds: . antiseptic oral rinse  7 mL Mouth Rinse QID  . aspirin  81 mg Per Tube Daily  . atorvastatin  80 mg Per Tube q1800  . chlorhexidine  15 mL Mouth Rinse BID  . cholecalciferol  5,000 Units Per Tube Daily  . diltiazem  10 mg Intravenous Once  . enoxaparin (LOVENOX) injection  50 mg Subcutaneous BID  . feeding supplement (VITAL HIGH  PROTEIN)  1,000 mL Per Tube Q24H  . ipratropium-albuterol  3 mL Nebulization Q4H  . methylPREDNISolone (SOLU-MEDROL) injection  40 mg Intravenous Q12H  . pantoprazole (PROTONIX) IV  40 mg Intravenous Daily  . saccharomyces boulardii  250 mg Oral BID    Continuous Infusions: . sodium chloride Stopped (09/11/14 2300)  . diltiazem (CARDIZEM) infusion 10 mg/hr (09/12/14 1100)  . fentaNYL infusion INTRAVENOUS 300 mcg/hr (09/12/14 0800)  . phenylephrine (NEO-SYNEPHRINE) Adult infusion 90 mcg/min (09/12/14 1230)  .  sodium bicarbonate  infusion 1000 mL 75 mL/hr at 09/12/14 1100    Past Medical History  Diagnosis Date  . Hypertension   . Cancer   . COPD (chronic obstructive pulmonary disease)   . Anginal pain   . Dementia     Past Surgical History  Procedure Laterality Date  . Mastectomy      [place], [place],  [place] Pager [phone] After Hours Pager

## 2014-09-12 NOTE — Procedures (Signed)
Arterial Catheter Insertion Procedure Note NURI BRANCA [id number] 08-05-1941  Procedure: Insertion of Arterial Catheter  Indications: Blood pressure monitoring and Frequent blood sampling  Procedure Details Consent: Risks of procedure as well as the alternatives and risks of each were explained to the (patient/caregiver).  Consent for procedure obtained. Time Out: Verified patient identification, verified procedure, site/side was marked, verified correct patient position, special equipment/implants available, medications/allergies/relevent history reviewed, required imaging and test results available.  Performed  Maximum sterile technique was used including antiseptics, cap, gloves, gown, hand hygiene, mask and sheet. Skin prep: Chlorhexidine; local anesthetic administered 20 gauge catheter was inserted into right radial artery using the Seldinger technique.  Evaluation Blood flow good; BP tracing good. Complications: No apparent complications.   Phillis Knack [facility] 09/12/2014

## 2014-09-12 NOTE — Progress Notes (Signed)
PULMONARY / CRITICAL CARE MEDICINE   Name: Carmen Dean MRN: [id number] DOB: July 11, 1941    ADMISSION DATE:  09/07/2014 CONSULTATION DATE:  10/19  REFERRING MD :  [facility] CHIEF COMPLAINT:  Hypercarbic Respiratory failure    HISTORY OF PRESENT ILLNESS:   73 yo with a history of COPD and asthma who was admitted on 10/16 for A fib with RVR, UTI, hyponatremia and encephalopathy. These had all improved as of 10/18. On 10/19 she had increasing WOB and discomfort and placed on biPAP for hypercapnic respiratory failure. Failed biPAP and intubated.    STUDIES:  Echo 10/19: Left ventricle: The cavity size was normal. Wall thickness was normal. Systolic function was normal. The estimated ejection fraction was in the range of 60% to 65%. Wall motion was normal there were no regional wall motion abnormalities. Left ventricular diastolic function parameters were normal.Aortic valve: There was trivial regurgitation.Mitral valve: There was mild regurgitation.Left atrium: The atrium was mildly dilated.Tricuspid valve: There was moderate regurgitation. CT angio chest 10/20 >>  SIGNIFICANT EVENTS: 10/19: intubated   SUBJECTIVE:  Anxious this morning, continued shock, recurrent a-fib with rvr   VITAL SIGNS: Temp:  [97.7 F (36.5 C)-99.6 F (37.6 C)] 99.6 F (37.6 C) (10/20 0715) Pulse Rate:  [57-88] 78 (10/20 0715) Resp:  [11-24] 18 (10/20 0715) BP: (40-172)/(17-129) 94/72 mmHg (10/20 0715) SpO2:  [70 %-100 %] 92 % (10/20 0751) FiO2 (%):  [40 %-70 %] 40 % (10/20 0751) HEMODYNAMICS:   VENTILATOR SETTINGS: Vent Mode:  [-] PRVC FiO2 (%):  [40 %-70 %] 40 % Set Rate:  [15 bmp-18 bmp] 18 bmp Vt Set:  [420 mL-500 mL] 500 mL PEEP:  [5 cmH20] 5 cmH20 Plateau Pressure:  [15 cmH20-22 cmH20] 20 cmH20 INTAKE / OUTPUT:  Intake/Output Summary (Last 24 hours) at 09/12/14 1004 Last data filed at 09/12/14 0700  Gross per 24 hour  Intake 1787.58 ml  Output   1020 ml  Net 767.58 ml    PHYSICAL  EXAMINATION: General:  Anxious on vent HEENT: NCAT, ETT in place PULM : Good air movement, no wheezing CV: Irreg irreg, tachy AB: BS+, soft, nontender Ext: warm, no edema Neuro: awake, anxious on vent, nods head, tremors all over  LABS:  CBC  Recent Labs Lab 09/10/14 0326 09/11/14 0417 09/12/14 0536  WBC 10.1 8.4 10.0  HGB 11.6* 11.7* 11.1*  HCT 35.9* 37.2 34.9*  PLT 335 325 394   Coag's  Recent Labs Lab 09/11/14 0417  APTT 37  INR 1.13   BMET  Recent Labs Lab 09/10/14 0326 09/11/14 0417 09/12/14 0410  NA 128* 132* 130*  K 4.9 4.3 4.6  CL 98 102 98  CO2 19 16* 16*  BUN 16 11 16   CREATININE 0.80 0.69 0.98  GLUCOSE 112* 127* 114*   Electrolytes  Recent Labs Lab 09/10/14 0326 09/11/14 0417 09/12/14 0410  CALCIUM 8.8 8.2* 8.4  MG 1.6 1.9  --    Sepsis Markers  Recent Labs Lab 09/11/14 1858 09/12/14 0410  PROCALCITON <0.10 <0.10   ABG  Recent Labs Lab 09/11/14 1807 09/12/14 0405 09/12/14 0650  PHART 7.197* 7.161* 7.146*  PCO2ART 50.5* 52.5* 53.7*  PO2ART 200.0* 74.1* 75.8*   Liver Enzymes  Recent Labs Lab 08/29/2014 1958 09/10/14 0326  AST 29 24  ALT 19 17  ALKPHOS 90 73  BILITOT 0.4 <0.2*  ALBUMIN 3.5 2.6*   Cardiac Enzymes  Recent Labs Lab 09/11/14 1624 09/11/14 1858 09/11/14 2146 09/12/14 0410  TROPONINI  --  <0.30 <  0.30 <0.30  PROBNP 3157.0*  --   --   --    Glucose  Recent Labs Lab 09/12/14 0413  GLUCAP 113*    Imaging Dg Chest Port 1 View  09/11/2014   CLINICAL DATA:  Endotracheal tube placement.  Obtunded.  EXAM: PORTABLE CHEST - 1 VIEW  COMPARISON:  One-view chest 09/14/2014.  FINDINGS: The heart size is normal. The patient is now intubated. The endotracheal tube terminates 5 cm above the carina. Bibasilar airspace disease is slightly increased since prior exam, left greater than right. Diffuse emphysematous changes are noted. Surgical clips are present in the left axilla. The patient is status post left  mastectomy.  IMPRESSION: 1. The endotracheal tube terminates 5 cm above the carina. 2. Increasing bibasilar airspace disease. While this may represent atelectasis and improved hydration status, developing infection is also considered.   Electronically Signed   By: Lawrence Santiago M.D.   On: 09/11/2014 15:37   Dg Abd Portable 1v  09/11/2014   CLINICAL DATA:  OG tube placement.  EXAM: PORTABLE ABDOMEN - 1 VIEW  COMPARISON:  Chest radiograph 09/11/2014  FINDINGS: The enteric tube terminates in the body of the stomach, in satisfactory position. There is mild gaseous distention of the stomach. Gas is seen within nondistended colon and small bowel. Linear bibasilar opacities are noted.  IMPRESSION: Enteric (orogastric versus nasogastric) tube terminates in the body of stomach.   Electronically Signed   By: Curlene Dolphin M.D.   On: 09/11/2014 17:31     ASSESSMENT / PLAN:  PULMONARY OETT 10/19>>> A: Acute hypercarbic respiratory failure of uncertain etiology> has COPD but no wheezing today, consider PE Tobacco abuse CXR shows bibasilar airspace disease. PNA vs atelectasis?  P:   Continue Full vent support Continue Scheduled albuterol/ipatropium with prn albuterol Solumedrol 40 q 12h for now Daily SBT as indicated   CARDIOVASCULAR CV A:  Acute afib with RVR > recurrent PMH HTN Hypotensive following intubation (likely medication related) > persists, uncertain etiology LVEF normal, consider diastolic dysfunction with high I/O ratio and interstitial edema P:  Restart dilt gtt  Neo gtt MAP >65 Place CVL, measure CVP Place a-line Continue lovenox  RENAL A:   Hyponatremia- improving after stopping HCTZ Non-anion gap metabolic acidosis> diarrhea? P:   Check urine anion gap Start bicarb replacement  Monitor BMET and UOP Replace electrolytes as needed Check lactate  GASTROINTESTINAL A:   No acute issues P:   OG tube Start tube feeding SUP: PPI   HEMATOLOGIC A:   Normocytic  anemia- no evidence of acute blood loss Iron deficient anemia P:  Iron DVT prophylaxis: lovenox (per cards)  INFECTIOUS A:  Recent UTI but no clear source of active infection P:   Sputum 10/19>>> Urine 10/16 >> 9K D/c ceftriaxone Culture if >38.3C  ENDOCRINE A:  On steroids, anticipate hyperglycemia  P:   ICU hyperglycemia protocol  NEUROLOGIC A:  Toxic metabolic encephalopathy- resolved  Anxiety  Essential tremor? Sedation needs for vent P:   RASS goal: -1 PAD protocol 2 with fentanyl gtt and versed prn WUA daily/ SBT daily  Supportive care If tremor continues post ativan get EED    Family updated: 10/20 husband updated at bedside re: plan of care.    Interdisciplinary Family Meeting: none   TODAY'S SUMMARY: Intubated. Will continue to treat for COPD exacerbation. But will work up for PE and c.diff, start bicarb for persistent non-anion gap metabolic acidosis.   I have personally obtained a history, examined the patient,  evaluated laboratory and imaging results, formulated the assessment and plan and placed orders. Initially tried Bipap & even though Abg improved mildly, mental status remained poor, hence intubated, dropped BP post intubation , improved with fluids , 'double clutching' noted on vent even with deep sedation, tried other modes (PCV) but unable to eradicate, do not think she needs paralytic as long as can ventilate.Avoid autoPEEP by keeping RR low.  CRITICAL CARE: The patient is critically ill with multiple organ systems failure and requires high complexity decision making for assessment and support, frequent evaluation and titration of therapies, application of advanced monitoring technologies and extensive interpretation of multiple databases. Critical Care Time devoted to patient care services described in this note is 45 minutes.   Roselie Awkward, MD [place] [facility] Pager: [phone] Cell: [phone] If no response, call [phone]    09/12/2014,  10:04 AM

## 2014-09-12 NOTE — Progress Notes (Signed)
OT Cancellation Note  Patient Details Name: Carmen Dean MRN: [id number] DOB: 18-Jul-1941   Cancelled Treatment:    Reason Eval/Treat Not Completed:  Pt with medical decline since OT ordered, please reorder as appropriate. Thank you.  Malka So 09/12/2014, 4:07 PM

## 2014-09-12 NOTE — Progress Notes (Signed)
Pt with irregular heart rhythm-atrial fib with rate 120-140's; VS per flowsheet; MD aware and at bedside talking c pt spouse; cardizem bolus and gtt given per MD orders; will continue to closely monitor. Staff having difficulty obtaining accurate BP via automatic cuff; manual cuff used with BP measuring 85-27'P systolic; central line placement ordered for after stat CT chest

## 2014-09-12 NOTE — Progress Notes (Signed)
Pt's arterial line and TLC inserted; awaiting x ray for line placement confirmation; MD aware of CVP measurement 25; orders received; will continue to closely monitor

## 2014-09-13 ENCOUNTER — Inpatient Hospital Stay (HOSPITAL_COMMUNITY): Payer: Medicare Other

## 2014-09-13 ENCOUNTER — Encounter (HOSPITAL_COMMUNITY): Payer: Self-pay | Admitting: Physician Assistant

## 2014-09-13 DIAGNOSIS — D62 Acute posthemorrhagic anemia: Secondary | ICD-10-CM

## 2014-09-13 DIAGNOSIS — R579 Shock, unspecified: Secondary | ICD-10-CM

## 2014-09-13 LAB — TROPONIN I: Troponin I: <0.3 ng/mL

## 2014-09-13 LAB — GLUCOSE, CAPILLARY
GLUCOSE-CAPILLARY: 183 mg/dL — AB (ref 70–99)
Glucose-Capillary: 174 mg/dL — ABNORMAL HIGH (ref 70–99)
Glucose-Capillary: 241 mg/dL — ABNORMAL HIGH (ref 70–99)
Glucose-Capillary: 202 mg/dL — ABNORMAL HIGH (ref 70–99)
Glucose-Capillary: 193 mg/dL — ABNORMAL HIGH (ref 70–99)
Glucose-Capillary: 164 mg/dL — ABNORMAL HIGH (ref 70–99)
Glucose-Capillary: 162 mg/dL — ABNORMAL HIGH (ref 70–99)

## 2014-09-13 LAB — CBC
HCT: 29.2 % — ABNORMAL LOW (ref 36.0–46.0)
Hemoglobin: 9.8 g/dL — ABNORMAL LOW (ref 12.0–15.0)
MCH: 30.2 pg (ref 26.0–34.0)
MCHC: 33.6 g/dL (ref 30.0–36.0)
MCV: 90.1 fL (ref 78.0–100.0)
Platelets: 217 10*3/uL (ref 150–400)
RBC: 3.24 MIL/uL — ABNORMAL LOW (ref 3.87–5.11)
RDW: 14.5 % (ref 11.5–15.5)
WBC: 15.2 10*3/uL — ABNORMAL HIGH (ref 4.0–10.5)

## 2014-09-13 LAB — BLOOD GAS, ARTERIAL
Acid-base deficit: 9.3 mmol/L — ABNORMAL HIGH (ref 0.0–2.0)
Acid-base deficit: 6.2 mmol/L — ABNORMAL HIGH (ref 0.0–2.0)
Bicarbonate: 17.1 mEq/L — ABNORMAL LOW (ref 20.0–24.0)
Bicarbonate: 20.4 meq/L (ref 20.0–24.0)
FIO2: 0.4 %
FIO2: 0.4 %
MECHVT: 500 mL
MECHVT: 500 mL
O2 SAT: 95.2 %
O2 Saturation: 95.4 %
PCO2 ART: 44 mmHg (ref 35.0–45.0)
PEEP/CPAP: 5 cmH2O
PEEP: 5 cmH2O
PO2 ART: 91.1 mmHg (ref 80.0–100.0)
Patient temperature: 98.6
Patient temperature: 98.6
RATE: 18 resp/min
RATE: 15 {breaths}/min
TCO2: 18.5 mmol/L (ref 0–100)
TCO2: 22.1 mmol/L (ref 0–100)
pCO2 arterial: 53.5 mmHg — ABNORMAL HIGH (ref 35.0–45.0)
pH, Arterial: 7.215 — ABNORMAL LOW (ref 7.350–7.450)
pH, Arterial: 7.207 — ABNORMAL LOW (ref 7.350–7.450)
pO2, Arterial: 88.2 mmHg (ref 80.0–100.0)

## 2014-09-13 LAB — BASIC METABOLIC PANEL
ANION GAP: 18 — AB (ref 5–15)
BUN: 24 mg/dL — ABNORMAL HIGH (ref 6–23)
CO2: 16 mEq/L — ABNORMAL LOW (ref 19–32)
Calcium: 7.4 mg/dL — ABNORMAL LOW (ref 8.4–10.5)
Chloride: 96 mEq/L (ref 96–112)
Creatinine, Ser: 1.32 mg/dL — ABNORMAL HIGH (ref 0.50–1.10)
GFR calc Af Amer: 45 mL/min — ABNORMAL LOW (ref >90)
GFR, EST NON AFRICAN AMERICAN: 39 mL/min — AB (ref >90)
Glucose, Bld: 187 mg/dL — ABNORMAL HIGH (ref 70–99)
POTASSIUM: 3.9 meq/L (ref 3.7–5.3)
SODIUM: 130 meq/L — AB (ref 137–147)

## 2014-09-13 LAB — CARBOXYHEMOGLOBIN
CARBOXYHEMOGLOBIN: 0.6 % (ref 0.5–1.5)
METHEMOGLOBIN: 0.6 % (ref 0.0–1.5)
O2 Saturation: 65.3 %
Total hemoglobin: 6.3 g/dL — CL (ref 12.0–16.0)

## 2014-09-13 LAB — BASIC METABOLIC PANEL WITH GFR
Anion gap: 19 — ABNORMAL HIGH (ref 5–15)
BUN: 26 mg/dL — ABNORMAL HIGH (ref 6–23)
CO2: 21 meq/L (ref 19–32)
Calcium: 6.7 mg/dL — ABNORMAL LOW (ref 8.4–10.5)
Chloride: 90 meq/L — ABNORMAL LOW (ref 96–112)
Creatinine, Ser: 1.5 mg/dL — ABNORMAL HIGH (ref 0.50–1.10)
GFR calc Af Amer: 39 mL/min — ABNORMAL LOW
GFR calc non Af Amer: 34 mL/min — ABNORMAL LOW
Glucose, Bld: 166 mg/dL — ABNORMAL HIGH (ref 70–99)
Potassium: 3.6 meq/L — ABNORMAL LOW (ref 3.7–5.3)
Sodium: 130 meq/L — ABNORMAL LOW (ref 137–147)

## 2014-09-13 LAB — CBC WITH DIFFERENTIAL/PLATELET
BASOS ABS: 0 10*3/uL (ref 0.0–0.1)
Basophils Relative: 0 % (ref 0–1)
EOS PCT: 0 % (ref 0–5)
Eosinophils Absolute: 0 10*3/uL (ref 0.0–0.7)
HCT: 22.5 % — ABNORMAL LOW (ref 36.0–46.0)
Hemoglobin: 7.3 g/dL — ABNORMAL LOW (ref 12.0–15.0)
LYMPHS ABS: 0.3 10*3/uL — AB (ref 0.7–4.0)
LYMPHS PCT: 3 % — AB (ref 12–46)
MCH: 31.6 pg (ref 26.0–34.0)
MCHC: 32.4 g/dL (ref 30.0–36.0)
MCV: 97.4 fL (ref 78.0–100.0)
Monocytes Absolute: 0.6 10*3/uL (ref 0.1–1.0)
Monocytes Relative: 6 % (ref 3–12)
NEUTROS ABS: 8.7 10*3/uL — AB (ref 1.7–7.7)
Neutrophils Relative %: 91 % — ABNORMAL HIGH (ref 43–77)
PLATELETS: 303 10*3/uL (ref 150–400)
RBC: 2.31 MIL/uL — AB (ref 3.87–5.11)
RDW: 14.3 % (ref 11.5–15.5)
WBC: 9.5 10*3/uL (ref 4.0–10.5)

## 2014-09-13 LAB — PROCALCITONIN

## 2014-09-13 LAB — CORTISOL: Cortisol, Plasma: 19.1 ug/dL

## 2014-09-13 LAB — LACTIC ACID, PLASMA: Lactic Acid, Venous: 5.4 mmol/L — ABNORMAL HIGH (ref 0.5–2.2)

## 2014-09-13 LAB — PREPARE RBC (CROSSMATCH)

## 2014-09-13 LAB — ABO/RH: ABO/RH(D): O NEG

## 2014-09-13 MED ORDER — NOREPINEPHRINE BITARTRATE 1 MG/ML IV SOLN
2.0000 ug/min | INTRAVENOUS | Status: DC
  Administered 2014-09-13 – 2014-09-14 (×3): 2 ug/min via INTRAVENOUS
  Administered 2014-09-15: 20 ug/min via INTRAVENOUS
  Administered 2014-09-15: 30 ug/min via INTRAVENOUS
  Filled 2014-09-13 (×6): qty 4

## 2014-09-13 MED ORDER — PIPERACILLIN-TAZOBACTAM 3.375 G IVPB
3.3750 g | Freq: Three times a day (TID) | INTRAVENOUS | Status: DC
  Administered 2014-09-13 – 2014-09-14 (×5): 3.375 g via INTRAVENOUS
  Filled 2014-09-13 (×6): qty 50

## 2014-09-13 MED ORDER — SODIUM BICARBONATE 8.4 % IV SOLN
100.0000 meq | Freq: Once | INTRAVENOUS | Status: AC
  Administered 2014-09-13: 100 meq via INTRAVENOUS
  Filled 2014-09-13: qty 100

## 2014-09-13 MED ORDER — SODIUM BICARBONATE 8.4 % IV SOLN
INTRAVENOUS | Status: AC
  Filled 2014-09-13: qty 100

## 2014-09-13 MED ORDER — AMIODARONE HCL IN DEXTROSE 360-4.14 MG/200ML-% IV SOLN
INTRAVENOUS | Status: AC
  Filled 2014-09-13: qty 200

## 2014-09-13 MED ORDER — SODIUM CHLORIDE 0.9 % IV SOLN: Freq: Once | INTRAVENOUS | Status: DC

## 2014-09-13 MED ORDER — SODIUM CHLORIDE 0.9 % IV SOLN
1.0000 mg/h | INTRAVENOUS | Status: DC
  Administered 2014-09-13: 1 mg/h via INTRAVENOUS
  Administered 2014-09-14 – 2014-09-15 (×2): 2 mg/h via INTRAVENOUS
  Filled 2014-09-13 (×3): qty 10

## 2014-09-13 MED ORDER — VANCOMYCIN HCL IN DEXTROSE 1-5 GM/200ML-% IV SOLN: 1000.0000 mg | INTRAVENOUS | Status: DC

## 2014-09-13 MED ORDER — AMIODARONE LOAD VIA INFUSION
150.0000 mg | Freq: Once | INTRAVENOUS | Status: AC
  Administered 2014-09-13: 150 mg via INTRAVENOUS
  Filled 2014-09-13: qty 83.34

## 2014-09-13 MED ORDER — AMIODARONE HCL IN DEXTROSE 360-4.14 MG/200ML-% IV SOLN
60.0000 mg/h | INTRAVENOUS | Status: AC
  Administered 2014-09-13 (×2): 60 mg/h via INTRAVENOUS
  Filled 2014-09-13: qty 200

## 2014-09-13 MED ORDER — AMIODARONE HCL IN DEXTROSE 360-4.14 MG/200ML-% IV SOLN
30.0000 mg/h | INTRAVENOUS | Status: DC
  Administered 2014-09-13 – 2014-09-15 (×5): 30 mg/h via INTRAVENOUS
  Filled 2014-09-13 (×9): qty 200

## 2014-09-13 MED ORDER — SODIUM CHLORIDE 0.9 % IJ SOLN: 10.0000 mL | INTRAMUSCULAR | Status: DC | PRN

## 2014-09-13 MED ORDER — VANCOMYCIN HCL IN DEXTROSE 1-5 GM/200ML-% IV SOLN
1000.0000 mg | Freq: Once | INTRAVENOUS | Status: AC
  Administered 2014-09-13: 1000 mg via INTRAVENOUS
  Filled 2014-09-13: qty 200

## 2014-09-13 MED ORDER — SODIUM CHLORIDE 0.9 % IJ SOLN
10.0000 mL | Freq: Two times a day (BID) | INTRAMUSCULAR | Status: DC
  Administered 2014-09-14 – 2014-09-15 (×2): 10 mL

## 2014-09-13 MED ORDER — SODIUM CHLORIDE 0.9 % IV BOLUS (SEPSIS)
500.0000 mL | Freq: Once | INTRAVENOUS | Status: AC
  Administered 2014-09-13: 500 mL via INTRAVENOUS

## 2014-09-13 MED ORDER — HYDROCORTISONE NA SUCCINATE PF 100 MG IJ SOLR
50.0000 mg | Freq: Four times a day (QID) | INTRAMUSCULAR | Status: DC
  Administered 2014-09-13 – 2014-09-15 (×8): 50 mg via INTRAVENOUS
  Filled 2014-09-13 (×13): qty 1

## 2014-09-13 NOTE — Progress Notes (Signed)
PULMONARY / CRITICAL CARE MEDICINE   Name: Carmen Dean MRN: [id number] DOB: 07-02-1941    ADMISSION DATE:  08/31/2014 CONSULTATION DATE:  10/19  REFERRING MD :  [facility] CHIEF COMPLAINT:  Hypercarbic Respiratory failure    HISTORY OF PRESENT ILLNESS:   73 yo with a history of COPD and asthma who was admitted on 10/16 for A fib with RVR, UTI, hyponatremia and encephalopathy. These had all improved as of 10/18. On 10/19 she had increasing WOB and discomfort and placed on biPAP for hypercapnic respiratory failure. Failed biPAP and intubated.    STUDIES:  Echo 10/19: Left ventricle: The cavity size was normal. Wall thickness was normal. Systolic function was normal. The estimated ejection fraction was in the range of 60% to 65%. Wall motion was normal there were no regional wall motion abnormalities. Left ventricular diastolic function parameters were normal.Aortic valve: There was trivial regurgitation.Mitral valve: There was mild regurgitation.Left atrium: The atrium was mildly dilated.Tricuspid valve: There was moderate regurgitation. CT angio chest 10/20 >> emphysema, no PE, small effusions  SIGNIFICANT EVENTS: 10/19: intubated  10/20: Anxious this morning, continued shock, recurrent a-fib with rvr   SUBJECTIVE:  10/21 > worsening shock, acidosis, hgb drop, improved HR with benzo, family reports high benzo intake at home   VITAL SIGNS: Temp:  [97.9 F (36.6 C)-99.6 F (37.6 C)] 98.3 F (36.8 C) (10/21 0400) Pulse Rate:  [25-138] 84 (10/21 0515) Resp:  [15-24] 15 (10/21 0515) BP: (73-140)/(20-108) 73/39 mmHg (10/21 0341) SpO2:  [85 %-100 %] 100 % (10/21 0515) FiO2 (%):  [40 %-50 %] 40 % (10/21 0341) HEMODYNAMICS: CVP:  [8 mmHg-25 mmHg] 8 mmHg VENTILATOR SETTINGS: Vent Mode:  [-] PRVC FiO2 (%):  [40 %-50 %] 40 % Set Rate:  [15 bmp-18 bmp] 15 bmp Vt Set:  [500 mL] 500 mL PEEP:  [5 cmH20] 5 cmH20 Plateau Pressure:  [16 cmH20-22 cmH20] 22 cmH20 INTAKE /  OUTPUT:  Intake/Output Summary (Last 24 hours) at 09/13/14 0603 Last data filed at 09/13/14 0600  Gross per 24 hour  Intake 3930.26 ml  Output    445 ml  Net 3485.26 ml    PHYSICAL EXAMINATION: General:  Sedated on vent HEENT: NCAT, ETT in place PULM : Good air movement, no wheezing CV: Irreg irreg, tachy AB: BS+, soft, nontender Ext: warm, no edema Neuro: asleep, stirs to touch  LABS:  CBC  Recent Labs Lab 09/11/14 0417 09/12/14 0536 09/13/14 0400  WBC 8.4 10.0 9.5  HGB 11.7* 11.1* 7.3*  HCT 37.2 34.9* 22.5*  PLT 325 394 303   Coag's  Recent Labs Lab 09/11/14 0417  APTT 37  INR 1.13   BMET  Recent Labs Lab 09/11/14 0417 09/12/14 0410 09/13/14 0400  NA 132* 130* 130*  K 4.3 4.6 3.9  CL 102 98 96  CO2 16* 16* 16*  BUN 11 16 24*  CREATININE 0.69 0.98 1.32*  GLUCOSE 127* 114* 187*   Electrolytes  Recent Labs Lab 09/10/14 0326 09/11/14 0417 09/12/14 0410 09/13/14 0400  CALCIUM 8.8 8.2* 8.4 7.4*  MG 1.6 1.9  --   --    Sepsis Markers  Recent Labs Lab 09/11/14 1858 09/12/14 0410 09/12/14 1550  LATICACIDVEN  --   --  1.9  PROCALCITON <0.10 <0.10  --    ABG  Recent Labs Lab 09/12/14 0405 09/12/14 0650 09/13/14 0402  PHART 7.161* 7.146* 7.215*  PCO2ART 52.5* 53.7* 44.0  PO2ART 74.1* 75.8* 91.1   Liver Enzymes  Recent Labs Lab 09/14/2014  1958 09/10/14 0326  AST 29 24  ALT 19 17  ALKPHOS 90 73  BILITOT 0.4 <0.2*  ALBUMIN 3.5 2.6*   Cardiac Enzymes  Recent Labs Lab 09/11/14 1624 09/11/14 1858 09/11/14 2146 09/12/14 0410  TROPONINI  --  <0.30 <0.30 <0.30  PROBNP 3157.0*  --   --   --    Glucose  Recent Labs Lab 09/12/14 0413 09/12/14 1214 09/12/14 1707 09/12/14 2100 09/13/14 0016 09/13/14 0406  GLUCAP 113* 131* 143* 190* 183* 174*    Imaging Ct Angio Chest Pe W/cm &/or Wo Cm  09/12/2014   CLINICAL DATA:  Multi organ system failure. COPD. Decreasing oxygen saturation.  EXAM: CT ANGIOGRAPHY CHEST WITH  CONTRAST  TECHNIQUE: Multidetector CT imaging of the chest was performed using the standard protocol during bolus administration of intravenous contrast. Multiplanar CT image reconstructions and MIPs were obtained to evaluate the vascular anatomy.  CONTRAST:  173mL OMNIPAQUE IOHEXOL 350 MG/ML SOLN  COMPARISON:  None.  FINDINGS: Chest wall: No breast masses, supraclavicular or axillary lymphadenopathy. The patient has had a left mastectomy. The thyroid gland is grossly normal. The bony thorax is intact. No destructive bone lesions or spinal canal compromise.  Mediastinum: The endotracheal tube is in good position at the mid tracheal level. There is an NG tube coursing down into the stomach. The heart is normal in size. No pericardial effusion. No mediastinal or hilar mass or adenopathy. Small scattered lymph nodes are noted. There is advanced atherosclerotic calcifications involving the aorta and coronary arteries but no focal aneurysm or dissection.  The pulmonary arterial tree is fairly well opacified. No filling defects are identified to suggest pulmonary embolism.  Lungs: There are advanced emphysematous changes along with bilateral pleural effusions and overlying atelectasis. Biapical scarring changes are noted. No focal infiltrates, pulmonary edema or worrisome pulmonary lesions.  Upper abdomen: No significant findings. Advanced atherosclerotic calcifications involving the abdominal aorta and branch vessels.  Review of the MIP images confirms the above findings.  IMPRESSION: 1. No CT findings for pulmonary embolism. 2. Advanced atherosclerotic calcifications involving the aorta and branch vessels including the coronary arteries. No dissection or focal aneurysm. 3. Severe emphysematous changes. 4. Bilateral pleural effusions with overlying atelectasis.   Electronically Signed   By: Kalman Jewels M.D.   On: 09/12/2014 11:56   Dg Chest Port 1 View  09/12/2014   CLINICAL DATA:  Acute respiratory failure   EXAM: PORTABLE CHEST - 1 VIEW  COMPARISON:  Film from earlier in the same day  FINDINGS: The endotracheal tube and nasogastric catheter are again identified and stable. The endotracheal tube lies approximately 17 mm above the carina and could be withdrawn 2-3 cm. The right central venous line is now noted with the catheter tip in the proximal superior vena cava. No pneumothorax is seen. Small bilateral pleural effusions are again noted.  IMPRESSION: No pneumothorax following central line placement.  The endotracheal tube has advanced somewhat in the interval from the prior exam. This could be withdrawn 2-3 cm.  These results will be called to the ordering clinician or representative by the Radiologist Assistant, and communication documented in the PACS or zVision Dashboard.   Electronically Signed   By: Inez Catalina M.D.   On: 09/12/2014 16:55   Portable Chest Xray In Am  09/12/2014   CLINICAL DATA:  Acute respiratory failure  EXAM: PORTABLE CHEST - 1 VIEW  COMPARISON:  09/11/2014  FINDINGS: Mild interstitial edema with small left pleural effusion. No pneumothorax.  The heart  is normal in size.  Endotracheal tube terminates 6 cm above the carina.  Enteric tube courses below the diaphragm.  IMPRESSION: Endotracheal tube terminates 6 cm above the carina.  Mild interstitial edema with small left pleural effusion   Electronically Signed   By: Julian Hy M.D.   On: 09/12/2014 07:53     ASSESSMENT / PLAN:  PULMONARY OETT 10/19>>> A:  Acute hypercarbic respiratory failure  AE COPD improving Tobacco abuse  P:   Continue Full vent support Continue Scheduled albuterol/ipatropium with prn albuterol Change steroids to hydrocortisone Daily SBT as indicated    CARDIOVASCULAR CV A:  Acute afib with RVR > recurrent, driven in part by benzo withdrawal PMH HTN Shock> septic? No clear source, cardiogenic? Hemorrhagic? LVEF normal, consider diastolic dysfunction with high I/O ratio and interstitial  edema P:  Stop dilt Start amiodarone Stat 12 lead, troponin Stat cortisol Start stress dose steroids Levophed, vaso, Neo gtt for MAP >65 CVP goal > 10 D/c lovenox with dropping Hgb  RENAL A:   Hyponatremia- improved Non-anion gap metabolic acidosis> ? RTA AKI P:   Continue bicarb replacement  Monitor BMET and UOP Replace electrolytes as needed Repeat BMET later today  GASTROINTESTINAL A:   No acute issues P:   OG tube Continue tube feeding SUP: PPI   HEMATOLOGIC A:   Worsening anemia on lovenox Iron deficient anemia P:  DVT prophylaxis: scd Stop lovenox Transfuse 2 U PRBC now given unexplained shock, drop in Hgb CT abdomen/pelvis to look for cause of bleed (RP?)  INFECTIOUS A:  Recent UTI but no clear source of active infection Septic shock? No clear source P:   Sputum 10/19>>> Urine 10/16 >> 9K Blood 10/21 >> Resp 10/21 >>  Empirically add vanc/zosyn for ?sepsis 10/21   ENDOCRINE A:  Hyperglycemia  P:   ICU hyperglycemia protocol Change solumedrol to [facility] 50q6 for mineralocorticoid benefit  NEUROLOGIC A:  Toxic metabolic encephalopathy- resolved  Benzodiazepine withdrawal Anxiety  Essential tremor? Sedation needs for vent P:   RASS goal: -1 PAD protocol with fentanyl gtt and versed gtt added 10/21 WUA daily/ SBT daily     Family updated: 10/21 husband, brother, children updated at bedside re: plan of care. They state her code status is Full Code   Interdisciplinary Family Meeting: none   TODAY'S SUMMARY: Worsening shock of uncertain etiology, stop dilt, start amiodarone for Afib;  volume resuscitate more, add versed gtt for benzo withdrawal; transfuse 2 U PRBC now and get CT abdomen   I have personally obtained a history, examined the patient, evaluated laboratory and imaging results, formulated the assessment and plan and placed orders.   CRITICAL CARE: The patient is critically ill with multiple organ systems failure and requires  high complexity decision making for assessment and support, frequent evaluation and titration of therapies, application of advanced monitoring technologies and extensive interpretation of multiple databases. Critical Care Time devoted to patient care services described in this note is 45 minutes.   Roselie Awkward, MD [facility] Pager: [phone] Cell: [phone] If no response, call [phone]    09/13/2014, 6:03 AM

## 2014-09-13 NOTE — Progress Notes (Signed)
MD confirmed correct placement of OG tube from CT scan. Tube feedings resumed, but restarted at half dose (62mL/hr) per MD orders. Will continue to monitor closely. Roxan Hockey, RN

## 2014-09-13 NOTE — Progress Notes (Signed)
Tube feedings held due to large amount of fluid coming out from subglottic suction. Will get X-ray/CT to confirm placement of OG tube before restarting tube feeds. Lungs clear but diminished.  MD aware. Roxan Hockey, RN

## 2014-09-13 NOTE — Progress Notes (Signed)
**Note De-Identified  Obfuscation** ETT pulled back 2 cm to 24 at lip; sputum collected and sent to lab

## 2014-09-13 NOTE — Progress Notes (Signed)
eLink Physician-Brief Progress Note Patient Name: Carmen Dean DOB: [birth year] MRN: [id number]   Date of Service  09/13/2014  HPI/Events of Note  CT shows large RP bleed Hb 9.8 post transfusion CVP 13- on 200 neo  eICU Interventions  Off heparin Monitor Hb      Intervention Category Intermediate Interventions: Diagnostic test evaluation  Daryle Amis V. 09/13/2014, 5:10 PM

## 2014-09-13 NOTE — Progress Notes (Addendum)
[facility] PROGRESS NOTE  S: Called to bedside by [facility] MD to evaluate patient for worsening hypotension and acidosis. Respiratory rate was recently increased for worsening acidosis. Patient was at max dose on phenylephrine and vasopressin for BP support and was on bicarb gtt for correction of acidemia. Prior to my arrival [facility] MD prescribed additional bicarb pushes, added norepinephrine, and decreased respiratory rate.   O: BP 73/39  Pulse 96  Temp(Src) 98.3 F (36.8 C) (Oral)  Resp 24  Ht 5\' 3"  (1.6 m)  Wt 47.2 kg (104 lb 0.9 oz)  BMI 18.44 kg/m2  SpO2 100%  General:  Elderly female appears comfortable on vent.  Neuro:  Sedated on vent HEENT:  [place]/AT, ETT in place, No JVD noted Cardiovascular:  Tachy, irregular. Flutter waves on monitor Lungs:  Diminished R > L, slight wheeze L base.  Abdomen:  Somewhat firm, non-tender, non-distended. Hypoactive BS Musculoskeletal:  ROM intact, no acute deformity Skin:  Intact, MMM  CBC Latest Ref Rng 09/13/2014 09/12/2014 09/11/2014  WBC 4.0 - 10.5 K/uL 9.5 10.0 8.4  Hemoglobin 12.0 - 15.0 g/dL 7.3(L) 11.1(L) 11.7(L)  Hematocrit 36.0 - 46.0 % 22.5(L) 34.9(L) 37.2  Platelets 150 - 400 K/uL 303 394 325    ABG    Component Value Date/Time   PHART 7.215* 09/13/2014 0402   PCO2ART 44.0 09/13/2014 0402   PO2ART 91.1 09/13/2014 0402   HCO3 17.1* 09/13/2014 0402   TCO2 18.5 09/13/2014 0402   ACIDBASEDEF 9.3* 09/13/2014 0402   O2SAT 95.2 09/13/2014 0402     A/P:  Refractory shock of uncertain etiology - add norepinephrine with careful titration based on tachycardia - MAP goal > 65 mm/Hg  Acute renal insufficiency (new) High AG Metabolic acidosis (worseing) - Continue bicarb gtt - Repeat ABG 0600 - May need CRRT if does not improve - Repeat lactic acid  Normocytic anemia (worseing) - Transfuse one unit PRBC now - Check H&H post transfusion - Check hemoccult - D/c lovenox - Consider d/c steroids if able tolerate for pulmonary  standpoint  Georgann Housekeeper, ACNP [facility] Pulmonology/Critical Care Pager [phone] or [phone]

## 2014-09-13 NOTE — Progress Notes (Signed)
ANTIBIOTIC CONSULT NOTE - INITIAL  Pharmacy Consult for Vancomycin and Zosyn Indication: rule out sepsis  No Known Allergies  Patient Measurements: Height: 5\' 3"  (160 cm) Weight: 104 lb 0.9 oz (47.2 kg) IBW/kg (Calculated) : 52.4  Vital Signs: Temp: 98.3 F (36.8 C) (10/21 0400) Temp Source: Oral (10/21 0400) BP: 73/39 mmHg (10/21 0341) Pulse Rate: 84 (10/21 0515) Intake/Output from previous day: 10/20 0701 - 10/21 0700 In: 4104.8 [I.V.:3689.4; NG/GT:415.3] Out: 405 [Urine:405] Intake/Output from this shift: Total I/O In: 2614.2 [I.V.:2204.2; NG/GT:410] Out: 145 [Urine:145]  Labs:  Recent Labs  09/11/14 0417 09/12/14 0410 09/12/14 0536 09/13/14 0400  WBC 8.4  --  10.0 9.5  HGB 11.7*  --  11.1* 7.3*  PLT 325  --  394 303  CREATININE 0.69 0.98  --  1.32*   Estimated Creatinine Clearance: 28.7 ml/min (by C-G formula based on Cr of 1.32). No results found for this basename: Letta Median, VANCORANDOM, GENTTROUGH, GENTPEAK, GENTRANDOM, TOBRATROUGH, TOBRAPEAK, TOBRARND, AMIKACINPEAK, AMIKACINTROU, AMIKACIN,  in the last 72 hours   Microbiology: Recent Results (from the past 720 hour(s))  URINE CULTURE     Status: None   Collection Time    09/21/2014  9:28 PM      Result Value Ref Range Status   Specimen Description URINE, RANDOM   Final   Special Requests NONE   Final   Culture  Setup Time     Final   Value: 09/09/2014 15:23     Performed at [facility]     Final   Value: 9,000 COLONIES/ML     Performed at [facility]   Culture     Final   Value: INSIGNIFICANT GROWTH     Performed at [facility]   Report Status 09/10/2014 FINAL   Final  MRSA PCR SCREENING     Status: None   Collection Time    09/09/14  3:39 PM      Result Value Ref Range Status   MRSA by PCR NEGATIVE  NEGATIVE Final   Comment:            The GeneXpert MRSA Assay (FDA     approved for NASAL specimens     only), is one component of a   comprehensive MRSA colonization     surveillance program. It is not     intended to diagnose MRSA     infection nor to guide or     monitor treatment for     MRSA infections.  MRSA PCR SCREENING     Status: None   Collection Time    09/11/14  1:26 PM      Result Value Ref Range Status   MRSA by PCR NEGATIVE  NEGATIVE Final   Comment:            The GeneXpert MRSA Assay (FDA     approved for NASAL specimens     only), is one component of a     comprehensive MRSA colonization     surveillance program. It is not     intended to diagnose MRSA     infection nor to guide or     monitor treatment for     MRSA infections.    Medical History: Past Medical History  Diagnosis Date  . Hypertension   . Cancer   . COPD (chronic obstructive pulmonary disease)   . Anginal pain   . Dementia     Medications:  Scheduled:  .  sodium chloride   Intravenous Once  . sodium chloride   Intravenous Once  . amiodarone      . amiodarone  150 mg Intravenous Once  . antiseptic oral rinse  7 mL Mouth Rinse QID  . aspirin  81 mg Per Tube Daily  . atorvastatin  80 mg Per Tube q1800  . chlorhexidine  15 mL Mouth Rinse BID  . cholecalciferol  5,000 Units Per Tube Daily  . hydrocortisone sodium succinate  50 mg Intravenous Q6H  . insulin aspart  2-6 Units Subcutaneous 6 times per day  . ipratropium-albuterol  3 mL Nebulization Q4H  . multivitamin  5 mL Per Tube Daily  . pantoprazole (PROTONIX) IV  40 mg Intravenous Daily  . saccharomyces boulardii  250 mg Oral BID  . sodium chloride  500 mL Intravenous Once   Assessment: 73 yo female with VDRF, hypotension, possible sepsis, for empiric antibiotics  Goal of Therapy:  Vancomycin trough level 15-20 mcg/ml  Plan:  Vancomycin 1 g IV now, then 750 mg IV q48h Zosyn 3.375 g IV q8h    Wake Conlee, Bronson Curb 09/13/2014,6:12 AM

## 2014-09-13 NOTE — Progress Notes (Signed)
2 units of blood transfused per MD order following blood transfusion protocol. Blood information verified by two RN's independently Elmarie Shiley, RN and [facility] RN). No signs of reaction during blood transfusions. Patient tolerated blood transfusions well. Roxan Hockey, RN

## 2014-09-13 NOTE — Consult Note (Signed)
CARDIOLOGY CONSULT NOTE   Patient ID: Carmen Dean MRN: [id number], DOB/AGE: 1941-04-04   Admit date: 09/13/2014 Date of Consult: 09/13/2014   Primary Physician: No primary provider on file. Primary Cardiologist: Dr. Cleta Alberts in [facility]  Pt. Profile  73 year old Caucasian female with past medical history of hypertension, COPD on home oxygen, dementia, history of old left breast cancer status post mastectomy in 1998, and a history of coronary artery disease status post stent placement in 2004 presented with a-fib with RVR and subsequently went into resp distress requiring intubation  Problem List  Past Medical History  Diagnosis Date  . Hypertension   . Cancer   . COPD (chronic obstructive pulmonary disease)   . Anginal pain   . Dementia     Past Surgical History  Procedure Laterality Date  . Mastectomy       Allergies  No Known Allergies  HPI   HPI obtained from medical record and family member as patient is intubated and sedated  The patient is a 73 year old Caucasian female with past medical history of hypertension, COPD on home oxygen, dementia, history of remote left breast cancer status post mastectomy in 1998, and a history of coronary artery disease status post stent placement in 2004. According to family member, she follows Dr. Daphene Jaeger in [place]. Per family, she has no prior history of arrhythmia. Her last visit with her cardiologist was in August of this year at which time she was still doing okay. For the last several years, patient has been in and out of hospital several times due to dehydration/UTI/deconditioning. According to the family, patient has not been very active in the last several years due to her clinical condition. She has not had any repeat stress test or cardiac catheterization since 2004. She has not experienced any exertional chest pain as far as the family knows. Patient was recently admitted in [place]  hospital for UTI and was discharged on 09/01/2014.  Her husband and her drove down to [place] on 09/03/2014 to visit her brother. She was doing well for the first few days after arrival in [place]. However starting on 09/06/2014, family noted patient began to have decreased appetite and weakness. She was sent to [facility] on Friday 09/19/2014 due to altered mental status, progressive weakness, and poor appetite. On arrival to the ED, it was noted the patient was in atrial fibrillation with heart rate of 140s. Initial laboratory finding was also significant for severe hyponatremia with sodium level 125 on arrival. She was originally admitted to the medicine service and was placed on full dose Lovenox. She was also placed on diltiazem drip and subsequently converted to normal sinus rhythm on 10/17. Echocardiogram was obtained on 09/10/2014 which showed EF 60-65% normal wall motion, mild MR, moderate TR, mildly dilated left atrium. Patient was doing well until the night of 09/11/2014 when she began to have progressive shortness of breath. Rapid response was called, critical care group was also consulted, patient was placed on BiPAP, however failed BiPAP and was eventually intubated. CTA of the chest was obtained which showed no pulmonary embolism, atherosclerotic calcification involving aorta and coronary arteries, no dissection or focal aneurysm, severe emphysematous changes, and bilateral pleural effusion with overlying atelectasis. She subsequently had hypotension requiring combination of pressors including Levophed, Neo-Synephrine and vasopressin. It was also noted, patient had a sudden drop in her hemoglobin. On the night of 10/20, her hemoglobin dropped from 11.1 down to 7.3. Looking back  her hemoglobin has been dropping since admission from 13.7 to as low as 6.3. She has received 2 units of blood this morning. Patient had recurrent atrial fibrillation requiring IV amiodarone. Cardiology  has been consulted for atrial fibrillation.    Inpatient Medications  . sodium chloride   Intravenous Once  . sodium chloride   Intravenous Once  . antiseptic oral rinse  7 mL Mouth Rinse QID  . aspirin  81 mg Per Tube Daily  . atorvastatin  80 mg Per Tube q1800  . chlorhexidine  15 mL Mouth Rinse BID  . cholecalciferol  5,000 Units Per Tube Daily  . hydrocortisone sodium succinate  50 mg Intravenous Q6H  . insulin aspart  2-6 Units Subcutaneous 6 times per day  . ipratropium-albuterol  3 mL Nebulization Q4H  . multivitamin  5 mL Per Tube Daily  . pantoprazole (PROTONIX) IV  40 mg Intravenous Daily  . piperacillin-tazobactam (ZOSYN)  IV  3.375 g Intravenous 3 times per day  . saccharomyces boulardii  250 mg Oral BID  . sodium chloride  10-40 mL Intracatheter Q12H  . [START ON 09/17/14] vancomycin  1,000 mg Intravenous Q48H    Family History Family History  Problem Relation Age of Onset  . Bleeding Disorder Father 78    died of internal bleeding  . Hypertension Mother   . Hypertension Brother      Social History History   Social History  . Marital Status: Married    Spouse Name: N/A    Number of Children: N/A  . Years of Education: N/A   Occupational History  . Not on file.   Social History Main Topics  . Smoking status: Never Smoker   . Smokeless tobacco: Not on file  . Alcohol Use: Yes  . Drug Use: No  . Sexual Activity: Not Currently   Other Topics Concern  . Not on file   Social History Narrative  . No narrative on file     Review of Systems  Unable to be obtained, patient is intubated and sedated    Physical Exam  Blood pressure 131/54, pulse 67, temperature 98.7 F (37.1 C), temperature source Oral, resp. rate 16, height 5\' 3"  (1.6 m), weight 104 lb 0.9 oz (47.2 kg), SpO2 100.00%.  General: intubated Neuro: sedated HEENT: Normal  Neck: Supple without bruits or JVD. Lungs:  Resp regular and unlabored, anterior exam CTA. On vent Heart: RRR  no s3, s4, or murmurs. Abdomen: abdomen distended Extremities: No clubbing, cyanosis or edema. DP/PT/Radials 2+ and equal bilaterally.  Labs   Recent Labs  09/11/14 1858 09/11/14 2146 09/12/14 0410 09/13/14 0630  TROPONINI <0.30 <0.30 <0.30 <0.30   Lab Results  Component Value Date   WBC 9.5 09/13/2014   HGB 7.3* 09/13/2014   HCT 22.5* 09/13/2014   MCV 97.4 09/13/2014   PLT 303 09/13/2014    Recent Labs Lab 09/10/14 0326  09/13/14 0400  NA 128*  < > 130*  K 4.9  < > 3.9  CL 98  < > 96  CO2 19  < > 16*  BUN 16  < > 24*  CREATININE 0.80  < > 1.32*  CALCIUM 8.8  < > 7.4*  PROT 5.9*  --   --   BILITOT <0.2*  --   --   ALKPHOS 73  --   --   ALT 17  --   --   AST 24  --   --   GLUCOSE 112*  < >  187*  < > = values in this interval not displayed. No results found for this basename: CHOL,  HDL,  LDLCALC,  TRIG    Radiology/Studies  Ct Angio Chest Pe W/cm &/or Wo Cm  09/12/2014   CLINICAL DATA:  Multi organ system failure. COPD. Decreasing oxygen saturation.  EXAM: CT ANGIOGRAPHY CHEST WITH CONTRAST  TECHNIQUE: Multidetector CT imaging of the chest was performed using the standard protocol during bolus administration of intravenous contrast. Multiplanar CT image reconstructions and MIPs were obtained to evaluate the vascular anatomy.  CONTRAST:  115mL OMNIPAQUE IOHEXOL 350 MG/ML SOLN  COMPARISON:  None.  FINDINGS: Chest wall: No breast masses, supraclavicular or axillary lymphadenopathy. The patient has had a left mastectomy. The thyroid gland is grossly normal. The bony thorax is intact. No destructive bone lesions or spinal canal compromise.  Mediastinum: The endotracheal tube is in good position at the mid tracheal level. There is an NG tube coursing down into the stomach. The heart is normal in size. No pericardial effusion. No mediastinal or hilar mass or adenopathy. Small scattered lymph nodes are noted. There is advanced atherosclerotic calcifications involving the aorta  and coronary arteries but no focal aneurysm or dissection.  The pulmonary arterial tree is fairly well opacified. No filling defects are identified to suggest pulmonary embolism.  Lungs: There are advanced emphysematous changes along with bilateral pleural effusions and overlying atelectasis. Biapical scarring changes are noted. No focal infiltrates, pulmonary edema or worrisome pulmonary lesions.  Upper abdomen: No significant findings. Advanced atherosclerotic calcifications involving the abdominal aorta and branch vessels.  Review of the MIP images confirms the above findings.  IMPRESSION: 1. No CT findings for pulmonary embolism. 2. Advanced atherosclerotic calcifications involving the aorta and branch vessels including the coronary arteries. No dissection or focal aneurysm. 3. Severe emphysematous changes. 4. Bilateral pleural effusions with overlying atelectasis.   Electronically Signed   By: Kalman Jewels M.D.   On: 09/12/2014 11:56   Dg Chest Port 1 View  09/13/2014   CLINICAL DATA:  Acute respiratory failure.  EXAM: PORTABLE CHEST - 1 VIEW  COMPARISON:  September 12, 2014.  FINDINGS: Stable cardiomediastinal silhouette. Mild left pleural effusion is again noted and unchanged. No pneumothorax is noted. Right internal jugular catheter line is unchanged in position with distal tip in expected position of the SVC. Endotracheal tube is unchanged in position compared to prior exam with distal tip approximately 5 mm above the carina. It is recommended to be withdrawn at least 2-3 cm. Nasogastric tube is seen entering the stomach. Right lung is clear.  IMPRESSION: Stable mild left pleural effusion. Endotracheal tube is unchanged in position with distal tip approximately 5 mm above the carina. It should be withdrawn at least 2-3 cm as recommended on prior exam as well. These results will be called to the ordering clinician or representative by the Radiologist Assistant, and communication documented in the PACS  or zVision Dashboard.   Electronically Signed   By: Sabino Dick M.D.   On: 09/13/2014 08:16   Dg Chest Port 1 View  09/12/2014   CLINICAL DATA:  Acute respiratory failure  EXAM: PORTABLE CHEST - 1 VIEW  COMPARISON:  Film from earlier in the same day  FINDINGS: The endotracheal tube and nasogastric catheter are again identified and stable. The endotracheal tube lies approximately 17 mm above the carina and could be withdrawn 2-3 cm. The right central venous line is now noted with the catheter tip in the proximal superior vena cava. No  pneumothorax is seen. Small bilateral pleural effusions are again noted.  IMPRESSION: No pneumothorax following central line placement.  The endotracheal tube has advanced somewhat in the interval from the prior exam. This could be withdrawn 2-3 cm.  These results will be called to the ordering clinician or representative by the Radiologist Assistant, and communication documented in the PACS or zVision Dashboard.   Electronically Signed   By: Inez Catalina M.D.   On: 09/12/2014 16:55   Portable Chest Xray In Am  09/12/2014   CLINICAL DATA:  Acute respiratory failure  EXAM: PORTABLE CHEST - 1 VIEW  COMPARISON:  09/11/2014  FINDINGS: Mild interstitial edema with small left pleural effusion. No pneumothorax.  The heart is normal in size.  Endotracheal tube terminates 6 cm above the carina.  Enteric tube courses below the diaphragm.  IMPRESSION: Endotracheal tube terminates 6 cm above the carina.  Mild interstitial edema with small left pleural effusion   Electronically Signed   By: Julian Hy M.D.   On: 09/12/2014 07:53   Dg Chest Port 1 View  09/11/2014   CLINICAL DATA:  Endotracheal tube placement.  Obtunded.  EXAM: PORTABLE CHEST - 1 VIEW  COMPARISON:  One-view chest 09/14/2014.  FINDINGS: The heart size is normal. The patient is now intubated. The endotracheal tube terminates 5 cm above the carina. Bibasilar airspace disease is slightly increased since prior exam,  left greater than right. Diffuse emphysematous changes are noted. Surgical clips are present in the left axilla. The patient is status post left mastectomy.  IMPRESSION: 1. The endotracheal tube terminates 5 cm above the carina. 2. Increasing bibasilar airspace disease. While this may represent atelectasis and improved hydration status, developing infection is also considered.   Electronically Signed   By: Lawrence Santiago M.D.   On: 09/11/2014 15:37   Dg Chest Portable 1 View  09/09/2014   CLINICAL DATA:  Weakness.  Loss of appetite.  Initial encounter  EXAM: PORTABLE CHEST - 1 VIEW  COMPARISON:  None.  FINDINGS: No cardiomegaly.  Negative aortic contours.  Pulmonary hyperinflation and apical emphysematous changes. No edema, effusion, pneumothorax, or convincing pneumonia. Slight increased density at the peripheral left base is likely from overlapping shadows, including EKG leads.  Left axillary dissection and mastectomy.  IMPRESSION: 1.  No active disease. 2. COPD.   Electronically Signed   By: Jorje Guild M.D.   On: 09/09/2014 00:28   Dg Abd Portable 1v  09/13/2014   CLINICAL DATA:  Orogastric tube placement  EXAM: PORTABLE ABDOMEN - 1 VIEW  COMPARISON:  Portable exam 1146 hr compared to 09/11/2014  FINDINGS: Rotated to the RIGHT.  Tip of orogastric tube projects over distal gastric antrum.  Bibasilar atelectasis.  Underlying emphysematous changes.  Nonobstructive bowel gas pattern.  Diffuse osseous demineralization.  IMPRESSION: Tip of orogastric tube projects over distal gastric antrum.   Electronically Signed   By: Lavonia Dana M.D.   On: 09/13/2014 12:07   Dg Abd Portable 1v  09/11/2014   CLINICAL DATA:  OG tube placement.  EXAM: PORTABLE ABDOMEN - 1 VIEW  COMPARISON:  Chest radiograph 09/11/2014  FINDINGS: The enteric tube terminates in the body of the stomach, in satisfactory position. There is mild gaseous distention of the stomach. Gas is seen within nondistended colon and small bowel. Linear  bibasilar opacities are noted.  IMPRESSION: Enteric (orogastric versus nasogastric) tube terminates in the body of stomach.   Electronically Signed   By: Curlene Dolphin M.D.   On: 09/11/2014  17:31    ECG  NSR with HR 60s, LVH  ASSESSMENT AND PLAN  1. A-fib with RVR - new per family (record requested)  - originally converted to NSR on 10/17, subsequently found to have severe anemia and likely internal bleeding, went back into atrial fibrillation. Placed on IV amiodarone, converted NSR again  - not a candidate for systemic anticoagulation due to severe anemia, nor is she a candidate for rate control med due to shock requiring multiple pressors  - continue amiodarone during mean time. Likely need further transfusion, will defer to [facility]  2. Hypotension: likely 2/2 severe resp insufficiency and blood loss 3. Severe anemia 4. UTI: treated per primary team 5. Severe hyponatremia 6. AMS on arrival: likely related to #4&5 7. CAD s/p stent in 2004 8. Hypertension 9. COPD on home oxygen 10. Dementia 11. history of old left breast cancer status post mastectomy in 1998  Signed, Meng, Hao, PA-C 09/13/2014, 2:35 PM  Attending Note:   The patient was seen and examined.  Agree with assessment and plan as noted above.  Changes made to the above note as needed.  Pt is critically ill. She is in rapid atrial fib. She was admitted with an incompletely treated UTI, severe hyponatremia and altered mental status Her Hb has dropped Abdomen is firm - I agree that she may have had a retroperitoneal bleed.   Awaiting CT of abdomen.    I have talked to family and told them that we are doing all that we can but that she is critically ill. She is not a candidate for anticoagulation due to her bleeding.  Thayer Headings, Brooke Bonito., MD, [facility] 09/13/2014, 3:44 PM [address]  [place] Pager [phone]

## 2014-09-14 ENCOUNTER — Inpatient Hospital Stay (HOSPITAL_COMMUNITY): Payer: Medicare Other

## 2014-09-14 DIAGNOSIS — N179 Acute kidney failure, unspecified: Secondary | ICD-10-CM

## 2014-09-14 LAB — CBC WITH DIFFERENTIAL/PLATELET
BASOS ABS: 0 10*3/uL (ref 0.0–0.1)
Basophils Relative: 0 % (ref 0–1)
Eosinophils Absolute: 0 10*3/uL (ref 0.0–0.7)
Eosinophils Relative: 0 % (ref 0–5)
HEMATOCRIT: 22.1 % — AB (ref 36.0–46.0)
HEMOGLOBIN: 7.8 g/dL — AB (ref 12.0–15.0)
LYMPHS ABS: 1 10*3/uL (ref 0.7–4.0)
LYMPHS PCT: 5 % — AB (ref 12–46)
MCH: 31.7 pg (ref 26.0–34.0)
MCHC: 35.3 g/dL (ref 30.0–36.0)
MCV: 89.8 fL (ref 78.0–100.0)
MONO ABS: 2.3 10*3/uL — AB (ref 0.1–1.0)
MONOS PCT: 12 % (ref 3–12)
NEUTROS ABS: 16.5 10*3/uL — AB (ref 1.7–7.7)
Neutrophils Relative %: 83 % — ABNORMAL HIGH (ref 43–77)
Platelets: 164 10*3/uL (ref 150–400)
RBC: 2.46 MIL/uL — ABNORMAL LOW (ref 3.87–5.11)
RDW: 15.1 % (ref 11.5–15.5)
WBC: 19.8 10*3/uL — AB (ref 4.0–10.5)

## 2014-09-14 LAB — GLUCOSE, CAPILLARY
GLUCOSE-CAPILLARY: 132 mg/dL — AB (ref 70–99)
GLUCOSE-CAPILLARY: 175 mg/dL — AB (ref 70–99)
Glucose-Capillary: 185 mg/dL — ABNORMAL HIGH (ref 70–99)
Glucose-Capillary: 203 mg/dL — ABNORMAL HIGH (ref 70–99)
Glucose-Capillary: 159 mg/dL — ABNORMAL HIGH (ref 70–99)
Glucose-Capillary: 145 mg/dL — ABNORMAL HIGH (ref 70–99)

## 2014-09-14 LAB — POCT I-STAT 3, ART BLOOD GAS (G3+)
Acid-Base Excess: 3 mmol/L — ABNORMAL HIGH (ref 0.0–2.0)
BICARBONATE: 26.9 meq/L — AB (ref 20.0–24.0)
O2 Saturation: 98 %
TCO2: 28 mmol/L (ref 0–100)
pCO2 arterial: 34.9 mmHg — ABNORMAL LOW (ref 35.0–45.0)
pH, Arterial: 7.493 — ABNORMAL HIGH (ref 7.350–7.450)
pO2, Arterial: 88 mmHg (ref 80.0–100.0)

## 2014-09-14 LAB — BASIC METABOLIC PANEL
Anion gap: 18 — ABNORMAL HIGH (ref 5–15)
BUN: 29 mg/dL — ABNORMAL HIGH (ref 6–23)
CHLORIDE: 83 meq/L — AB (ref 96–112)
CO2: 24 meq/L (ref 19–32)
Calcium: 6.2 mg/dL — CL (ref 8.4–10.5)
Creatinine, Ser: 2.15 mg/dL — ABNORMAL HIGH (ref 0.50–1.10)
GFR calc Af Amer: 25 mL/min — ABNORMAL LOW (ref >90)
GFR calc non Af Amer: 22 mL/min — ABNORMAL LOW (ref >90)
GLUCOSE: 188 mg/dL — AB (ref 70–99)
Potassium: 3.3 mEq/L — ABNORMAL LOW (ref 3.7–5.3)
SODIUM: 125 meq/L — AB (ref 137–147)

## 2014-09-14 LAB — CBC
HCT: 29 % — ABNORMAL LOW (ref 36.0–46.0)
Hemoglobin: 10.3 g/dL — ABNORMAL LOW (ref 12.0–15.0)
MCH: 31 pg (ref 26.0–34.0)
MCHC: 35.5 g/dL (ref 30.0–36.0)
MCV: 87.3 fL (ref 78.0–100.0)
Platelets: 142 10*3/uL — ABNORMAL LOW (ref 150–400)
RBC: 3.32 MIL/uL — ABNORMAL LOW (ref 3.87–5.11)
RDW: 14.5 % (ref 11.5–15.5)
WBC: 31 10*3/uL — AB (ref 4.0–10.5)

## 2014-09-14 LAB — PREPARE RBC (CROSSMATCH)

## 2014-09-14 MED ORDER — VANCOMYCIN HCL 500 MG IV SOLR
500.0000 mg | INTRAVENOUS | Status: DC
  Administered 2014-09-15: 500 mg via INTRAVENOUS
  Filled 2014-09-14: qty 500

## 2014-09-14 MED ORDER — SODIUM CHLORIDE 0.9 % IV BOLUS (SEPSIS)
500.0000 mL | Freq: Once | INTRAVENOUS | Status: AC
  Administered 2014-09-14: 500 mL via INTRAVENOUS

## 2014-09-14 MED ORDER — PIPERACILLIN-TAZOBACTAM IN DEX 2-0.25 GM/50ML IV SOLN
2.2500 g | Freq: Four times a day (QID) | INTRAVENOUS | Status: DC
  Administered 2014-09-15 (×2): 2.25 g via INTRAVENOUS
  Filled 2014-09-14 (×5): qty 50

## 2014-09-14 MED ORDER — SODIUM CHLORIDE 0.9 % IV SOLN
Freq: Once | INTRAVENOUS | Status: AC
  Administered 2014-09-14: 09:00:00 via INTRAVENOUS

## 2014-09-14 MED ORDER — POTASSIUM CHLORIDE 10 MEQ/50ML IV SOLN
10.0000 meq | INTRAVENOUS | Status: AC
  Administered 2014-09-14 (×2): 10 meq via INTRAVENOUS
  Filled 2014-09-14 (×2): qty 50

## 2014-09-14 NOTE — Progress Notes (Signed)
CARDIOLOGY CONSULT NOTE   Patient ID: Carmen Dean MRN: [id number], DOB/AGE: 72/02/1941   Admit date: 08/28/2014 Date of Consult: 09/14/2014   Primary Physician: No primary provider on file. Primary Cardiologist: Dr. Cleta Alberts in [facility]  Pt. Profile  73 year old Caucasian female with past medical history of hypertension, COPD on home oxygen, dementia, history of old left breast cancer status post mastectomy in 1998, and a history of coronary artery disease status post stent placement in 2004 presented with a-fib with RVR and subsequently went into resp distress requiring intubation  She was seen initially by cardiology on October 21. She had rapid atrial fibrillation in the setting of a large retroperitoneal hematoma.  She's been transfused. CT of the abdomen confirmed the diagnosis of retroperitoneal bleed. Her vital signs are more stable this morning. She may remain sedated. I talked with her 2 daughters at the bedside today.  Problem List  Past Medical History  Diagnosis Date  . Hypertension   . Cancer   . COPD (chronic obstructive pulmonary disease)   . Anginal pain   . Dementia   . CAD (coronary artery disease)     s/p cath and stent in 2004    Past Surgical History  Procedure Laterality Date  . Mastectomy       Allergies  No Known Allergies  HPI   HPI obtained from medical record and family member as patient is intubated and sedated  The patient is a 73 year old Caucasian female with past medical history of hypertension, COPD on home oxygen, dementia, history of remote left breast cancer status post mastectomy in 1998, and a history of coronary artery disease status post stent placement in 2004. According to family member, she follows Dr. Daphene Jaeger in [place]. Per family, she has no prior history of arrhythmia. Her last visit with her cardiologist was in August of this year at which time she was still doing okay. For the last several  years, patient has been in and out of hospital several times due to dehydration/UTI/deconditioning. According to the family, patient has not been very active in the last several years due to her clinical condition. She has not had any repeat stress test or cardiac catheterization since 2004. She has not experienced any exertional chest pain as far as the family knows. Patient was recently admitted in [place] hospital for UTI and was discharged on 09/01/2014.  Her husband and her drove down to [place] on 09/03/2014 to visit her brother. She was doing well for the first few days after arrival in [place]. However starting on 09/06/2014, family noted patient began to have decreased appetite and weakness. She was sent to [facility] on Friday 08/31/2014 due to altered mental status, progressive weakness, and poor appetite. On arrival to the ED, it was noted the patient was in atrial fibrillation with heart rate of 140s. Initial laboratory finding was also significant for severe hyponatremia with sodium level 125 on arrival. She was originally admitted to the medicine service and was placed on full dose Lovenox. She was also placed on diltiazem drip and subsequently converted to normal sinus rhythm on 10/17. Echocardiogram was obtained on 09/10/2014 which showed EF 60-65% normal wall motion, mild MR, moderate TR, mildly dilated left atrium. Patient was doing well until the night of 09/11/2014 when she began to have progressive shortness of breath. Rapid response was called, critical care group was also consulted, patient was placed on BiPAP, however failed BiPAP and was  eventually intubated. CTA of the chest was obtained which showed no pulmonary embolism, atherosclerotic calcification involving aorta and coronary arteries, no dissection or focal aneurysm, severe emphysematous changes, and bilateral pleural effusion with overlying atelectasis. She subsequently had hypotension requiring  combination of pressors including Levophed, Neo-Synephrine and vasopressin. It was also noted, patient had a sudden drop in her hemoglobin. On the night of 10/20, her hemoglobin dropped from 11.1 down to 7.3. Looking back her hemoglobin has been dropping since admission from 13.7 to as low as 6.3. She has received 2 units of blood this morning. Patient had recurrent atrial fibrillation requiring IV amiodarone. Cardiology has been consulted for atrial fibrillation.    Inpatient Medications  . sodium chloride   Intravenous Once  . antiseptic oral rinse  7 mL Mouth Rinse QID  . aspirin  81 mg Per Tube Daily  . atorvastatin  80 mg Per Tube q1800  . chlorhexidine  15 mL Mouth Rinse BID  . cholecalciferol  5,000 Units Per Tube Daily  . hydrocortisone sodium succinate  50 mg Intravenous Q6H  . insulin aspart  2-6 Units Subcutaneous 6 times per day  . ipratropium-albuterol  3 mL Nebulization Q4H  . multivitamin  5 mL Per Tube Daily  . pantoprazole (PROTONIX) IV  40 mg Intravenous Daily  . piperacillin-tazobactam (ZOSYN)  IV  3.375 g Intravenous 3 times per day  . saccharomyces boulardii  250 mg Oral BID  . sodium chloride  10-40 mL Intracatheter Q12H  . [START ON 10-05-2014] vancomycin  1,000 mg Intravenous Q48H    Family History Family History  Problem Relation Age of Onset  . Bleeding Disorder Father 32    died of internal bleeding  . Hypertension Mother   . Hypertension Brother      Social History History   Social History  . Marital Status: Married    Spouse Name: N/A    Number of Children: N/A  . Years of Education: N/A   Occupational History  . Not on file.   Social History Main Topics  . Smoking status: Former Research scientist (life sciences)  . Smokeless tobacco: Not on file     Comment: smoked for 30-40 yrs, 1ppd, quit 5 yrs ago  . Alcohol Use: Yes     Comment: occasional, 1-2 beers a wk  . Drug Use: No  . Sexual Activity: Not Currently   Other Topics Concern  . Not on file   Social  History Narrative  . No narrative on file     Physical Exam  Blood pressure 131/54, pulse 78, temperature 97.3 F (36.3 C), temperature source Oral, resp. rate 20, height 5\' 3"  (1.6 m), weight 104 lb 0.9 oz (47.2 kg), SpO2 99.00%.  General: intubated Neuro: sedated HEENT: Normal  Neck: Supple without bruits or JVD. Lungs:  Resp regular and unlabored, anterior exam CTA. On vent Heart: RRR no s3, s4, or murmurs. Abdomen: abdomen is slightly distended /  Firm  Extremities: No clubbing, cyanosis or edema. DP/PT/Radials 2+ and equal bilaterally.  Labs   Recent Labs  09/11/14 1858 09/11/14 2146 09/12/14 0410 09/13/14 0630  TROPONINI <0.30 <0.30 <0.30 <0.30   Lab Results  Component Value Date   WBC 19.8* 09/14/2014   HGB 7.8* 09/14/2014   HCT 22.1* 09/14/2014   MCV 89.8 09/14/2014   PLT 164 09/14/2014    Recent Labs Lab 09/10/14 0326  09/14/14 0415  NA 128*  < > 125*  K 4.9  < > 3.3*  CL 98  < >  83*  CO2 19  < > 24  BUN 16  < > 29*  CREATININE 0.80  < > 2.15*  CALCIUM 8.8  < > 6.2*  PROT 5.9*  --   --   BILITOT <0.2*  --   --   ALKPHOS 73  --   --   ALT 17  --   --   AST 24  --   --   GLUCOSE 112*  < > 188*  < > = values in this interval not displayed. No results found for this basename: CHOL,  HDL,  LDLCALC,  TRIG    Radiology/Studies  Ct Angio Chest Pe W/cm &/or Wo Cm  09/12/2014   CLINICAL DATA:  Multi organ system failure. COPD. Decreasing oxygen saturation.  EXAM: CT ANGIOGRAPHY CHEST WITH CONTRAST  TECHNIQUE: Multidetector CT imaging of the chest was performed using the standard protocol during bolus administration of intravenous contrast. Multiplanar CT image reconstructions and MIPs were obtained to evaluate the vascular anatomy.  CONTRAST:  186mL OMNIPAQUE IOHEXOL 350 MG/ML SOLN  COMPARISON:  None.  FINDINGS: Chest wall: No breast masses, supraclavicular or axillary lymphadenopathy. The patient has had a left mastectomy. The thyroid gland is grossly  normal. The bony thorax is intact. No destructive bone lesions or spinal canal compromise.  Mediastinum: The endotracheal tube is in good position at the mid tracheal level. There is an NG tube coursing down into the stomach. The heart is normal in size. No pericardial effusion. No mediastinal or hilar mass or adenopathy. Small scattered lymph nodes are noted. There is advanced atherosclerotic calcifications involving the aorta and coronary arteries but no focal aneurysm or dissection.  The pulmonary arterial tree is fairly well opacified. No filling defects are identified to suggest pulmonary embolism.  Lungs: There are advanced emphysematous changes along with bilateral pleural effusions and overlying atelectasis. Biapical scarring changes are noted. No focal infiltrates, pulmonary edema or worrisome pulmonary lesions.  Upper abdomen: No significant findings. Advanced atherosclerotic calcifications involving the abdominal aorta and branch vessels.  Review of the MIP images confirms the above findings.  IMPRESSION: 1. No CT findings for pulmonary embolism. 2. Advanced atherosclerotic calcifications involving the aorta and branch vessels including the coronary arteries. No dissection or focal aneurysm. 3. Severe emphysematous changes. 4. Bilateral pleural effusions with overlying atelectasis.   Electronically Signed   By: Kalman Jewels M.D.   On: 09/12/2014 11:56    ECG  Atrial fib with HR 80s.   ASSESSMENT AND PLAN  1. A-fib with RVR - new per family   - originally converted to NSR on 10/17, subsequently found to have severe anemia and likely internal bleeding, went back into atrial fibrillation. Placed on IV amiodarone,   - not a candidate for systemic anticoagulation due to severe anemia,   - continue amiodarone during mean time.   2. Hypotension: and blood loss, improved post transfusions 3. Severe anemia 4. UTI: treated per primary team 5. Severe hyponatremia 6. AMS on arrival: likely  related to #4&5 7. CAD s/p stent in 2004 8. Hypertension 9. COPD on home oxygen 10. Dementia 11. history of old left breast cancer status post mastectomy in 1998  Thayer Headings, Brooke Bonito., MD, [facility] 09/14/2014, 9:14 AM [address]  [place] Pager [phone]

## 2014-09-14 NOTE — Progress Notes (Signed)
NUTRITION FOLLOW-UP  DOCUMENTATION CODES Per approved criteria  -Underweight   INTERVENTION: Initiate Vital AF 1.2 @ 20 ml/hr via OG tube and increase by 10 ml every 4 hours to goal rate of 40 ml/hr.   Liquid MVI daily  Tube feeding regimen provides 1152 kcal (104% of needs), 72 grams of protein, and 778 ml of H2O.   NUTRITION DIAGNOSIS: Inadequate oral intake related to inability to eat as evidenced by NPO status; ongoing  Goal: Pt to meet >/= 90% of their estimated nutrition needs, not met.   Monitor:  Respiratory status, TF initiation and tolerance, weight trends, labs   ASSESSMENT: Pt with hx of COPD and asthma admitted on 10/16 for Afib with RVR, UTI. Pt had increased WOB on 10/19, failed biPAP and was intubated.   Patient is currently intubated on ventilator support MV: 9.8 L/min Temp (24hrs), Avg:97.7 F (36.5 C), Min:97.3 F (36.3 C), Max:98.3 F (36.8 C)  Pt did not meet criteria for malnutrition on admission but is at high risk due to weight loss and poor appetite PTA.  Pt with worsening shock, AKI worsening, pt now with retroperitoneal bleed. Pt discussing CVVHD with family.  TF stopped and resumed @ 20 ml/hr on 10/21 due to large amount of TF seen in subglottic suction. Xray confirmed location of OG tube (tip over distal gastric antrum).  Per MD plan is to resume TF this afternoon.   Pt on florastor two times daily.  CBG's: 159-203 Sodium low Potassium low on replacement BUN/Cr elevated  Height: Ht Readings from Last 1 Encounters:  09/09/14 5' 3"  (1.6 m)    Weight: Wt Readings from Last 1 Encounters:  09/09/14 104 lb 0.9 oz (47.2 kg)    BMI:  Body mass index is 18.44 kg/(m^2).  Estimated Nutritional Needs: Kcal: 1105 Protein: 60-75 grams  Fluid: >1.5 L/day  Skin: abrasion, skin tear, and ecchymosis  Diet Order:     Intake/Output Summary (Last 24 hours) at 09/14/14 1538 Last data filed at 09/14/14 1400  Gross per 24 hour  Intake 5462.98  ml  Output    535 ml  Net 4927.98 ml    Last BM: 10/18   Labs:   Recent Labs Lab 09/09/14 0506 09/10/14 0326 09/11/14 0417  09/13/14 0400 09/13/14 1500 09/14/14 0415  NA 124* 128* 132*  < > 130* 130* 125*  K 3.0* 4.9 4.3  < > 3.9 3.6* 3.3*  CL 89* 98 102  < > 96 90* 83*  CO2 20 19 16*  < > 16* 21 24  BUN 22 16 11   < > 24* 26* 29*  CREATININE 1.09 0.80 0.69  < > 1.32* 1.50* 2.15*  CALCIUM 9.3 8.8 8.2*  < > 7.4* 6.7* 6.2*  MG  --  1.6 1.9  --   --   --   --   GLUCOSE 114* 112* 127*  < > 187* 166* 188*  < > = values in this interval not displayed.  CBG (last 3)   Recent Labs  09/14/14 0418 09/14/14 0832 09/14/14 1140  GLUCAP 185* 203* 159*    Scheduled Meds: . antiseptic oral rinse  7 mL Mouth Rinse QID  . aspirin  81 mg Per Tube Daily  . atorvastatin  80 mg Per Tube q1800  . chlorhexidine  15 mL Mouth Rinse BID  . cholecalciferol  5,000 Units Per Tube Daily  . hydrocortisone sodium succinate  50 mg Intravenous Q6H  . insulin aspart  2-6 Units Subcutaneous 6  times per day  . ipratropium-albuterol  3 mL Nebulization Q4H  . multivitamin  5 mL Per Tube Daily  . pantoprazole (PROTONIX) IV  40 mg Intravenous Daily  . piperacillin-tazobactam (ZOSYN)  IV  2.25 g Intravenous 4 times per day  . saccharomyces boulardii  250 mg Oral BID  . sodium chloride  10-40 mL Intracatheter Q12H  . [START ON 09/19/14] vancomycin  500 mg Intravenous Q48H    Continuous Infusions: . sodium chloride 10 mL/hr at 09/13/14 0600  . amiodarone 30 mg/hr (09/14/14 0630)  . feeding supplement (VITAL AF 1.2 CAL) Stopped (09/13/14 2300)  . fentaNYL infusion INTRAVENOUS 150 mcg/hr (09/14/14 0330)  . midazolam (VERSED) infusion 2 mg/hr (09/14/14 0200)  . norepinephrine (LEVOPHED) Adult infusion 4 mcg/min (09/14/14 0932)  . phenylephrine (NEO-SYNEPHRINE) Adult infusion 200 mcg/min (09/14/14 1308)  . vasopressin (PITRESSIN) infusion - *FOR SHOCK* 0.03 Units/min (09/14/14 1308)   [place], [place], [place] Pager [phone] After Hours Pager

## 2014-09-14 NOTE — Progress Notes (Signed)
PULMONARY / CRITICAL CARE MEDICINE   Name: Carmen Dean MRN: [id number] DOB: [birth year]    ADMISSION DATE:  09/01/2014 CONSULTATION DATE:  10/19  REFERRING MD :  [facility] CHIEF COMPLAINT:  Hypercarbic Respiratory failure    HISTORY OF PRESENT ILLNESS:   73 yo with a history of COPD and asthma who was admitted on 10/16 for A fib with RVR, UTI, hyponatremia and encephalopathy. These had all improved as of 10/18. On 10/19 she had increasing WOB and discomfort and placed on biPAP for hypercapnic respiratory failure. Failed biPAP and intubated.    STUDIES:  Echo 10/19: Left ventricle: The cavity size was normal. Wall thickness was normal. Systolic function was normal. The estimated ejection fraction was in the range of 60% to 65%. Wall motion was normal there were no regional wall motion abnormalities. Left ventricular diastolic function parameters were normal.Aortic valve: There was trivial regurgitation.Mitral valve: There was mild regurgitation.Left atrium: The atrium was mildly dilated.Tricuspid valve: There was moderate regurgitation. CT angio chest 10/20 >> emphysema, no PE, small effusions CT Abdomen> large retroperitoneal bleed, gall bladder distension  SIGNIFICANT EVENTS: 10/19: intubated  10/20: Anxious this morning, continued shock, recurrent a-fib with rvr 10/21 > worsening shock, acidosis, hgb drop, improved HR with benzo, family reports high benzo intake at home   SUBJECTIVE:  10/22 > worsening shock, anuric, large RP bleed on CT, anemia> transfused, converted to sinus on amiodarone   VITAL SIGNS: Temp:  [97.3 F (36.3 C)-98.7 F (37.1 C)] 98.3 F (36.8 C) (10/22 0935) Pulse Rate:  [25-101] 61 (10/22 0935) Resp:  [13-30] 20 (10/22 0935) BP: (131)/(54) 131/54 mmHg (10/21 1115) SpO2:  [81 %-100 %] 93 % (10/22 0935) FiO2 (%):  [40 %] 40 % (10/22 0817) HEMODYNAMICS: CVP:  [2 mmHg-16 mmHg] 5 mmHg VENTILATOR SETTINGS: Vent Mode:  [-] PRVC FiO2 (%):  [40 %] 40 % Set  Rate:  [15 bmp-20 bmp] 20 bmp Vt Set:  [500 mL] 500 mL PEEP:  [5 cmH20] 5 cmH20 Plateau Pressure:  [20 cmH20-23 cmH20] 20 cmH20 INTAKE / OUTPUT:  Intake/Output Summary (Last 24 hours) at 09/14/14 0950 Last data filed at 09/14/14 0935  Gross per 24 hour  Intake 6030.13 ml  Output   1015 ml  Net 5015.13 ml    PHYSICAL EXAMINATION: General:  Sedated on vent HEENT: NCAT, ETT in place PULM : Good air movement, no wheezing CV: Regular rate and rhythm, no murmur AB: BS+, soft, nontender Ext: warm, no edema Neuro: asleep, stirs to touch  LABS:  CBC  Recent Labs Lab 09/13/14 0400 09/13/14 1500 09/14/14 0415  WBC 9.5 15.2* 19.8*  HGB 7.3* 9.8* 7.8*  HCT 22.5* 29.2* 22.1*  PLT 303 217 164   Coag's  Recent Labs Lab 09/11/14 0417  APTT 37  INR 1.13   BMET  Recent Labs Lab 09/13/14 0400 09/13/14 1500 09/14/14 0415  NA 130* 130* 125*  K 3.9 3.6* 3.3*  CL 96 90* 83*  CO2 16* 21 24  BUN 24* 26* 29*  CREATININE 1.32* 1.50* 2.15*  GLUCOSE 187* 166* 188*   Electrolytes  Recent Labs Lab 09/10/14 0326 09/11/14 0417  09/13/14 0400 09/13/14 1500 09/14/14 0415  CALCIUM 8.8 8.2*  < > 7.4* 6.7* 6.2*  MG 1.6 1.9  --   --   --   --   < > = values in this interval not displayed. Sepsis Markers  Recent Labs Lab 09/11/14 1858 09/12/14 0410 09/12/14 1550 09/13/14 0400 09/13/14 0525  LATICACIDVEN  --   --  1.9  --  5.4*  PROCALCITON <0.10 <0.10  --  <0.10  --    ABG  Recent Labs Lab 09/13/14 0402 09/13/14 0625 09/14/14 0830  PHART 7.215* 7.207* 7.493*  PCO2ART 44.0 53.5* 34.9*  PO2ART 91.1 88.2 88.0   Liver Enzymes  Recent Labs Lab 09/04/2014 1958 09/10/14 0326  AST 29 24  ALT 19 17  ALKPHOS 90 73  BILITOT 0.4 <0.2*  ALBUMIN 3.5 2.6*   Cardiac Enzymes  Recent Labs Lab 09/11/14 1624  09/11/14 2146 09/12/14 0410 09/13/14 0630  TROPONINI  --   < > <0.30 <0.30 <0.30  PROBNP 3157.0*  --   --   --   --   < > = values in this interval not  displayed. Glucose  Recent Labs Lab 09/13/14 1146 09/13/14 1511 09/13/14 2059 09/14/14 0027 09/14/14 0418 09/14/14 0832  GLUCAP 193* 164* 162* 175* 185* 203*    Imaging Ct Abdomen Pelvis Wo Contrast  09/13/2014   CLINICAL DATA:  Patient anit coagulation with profound anemia. Concern for retroperitoneal hemorrhage.  EXAM: CT ABDOMEN AND PELVIS WITHOUT CONTRAST  TECHNIQUE: Multidetector CT imaging of the abdomen and pelvis was performed following the standard protocol without IV contrast.  COMPARISON:  Nine  FINDINGS: Lower chest: Bilateral small pleural effusions. No pericardial fluid.  Hepatobiliary: Some mild fluid along the margin of the liver. No focal hepatic lesion. The gallbladder is distended to 5.7 cm.  Pancreas: Pancreas is normal. No duct dilatation. No pancreatic inflammation.  Spleen: Spleen is normal.  Adrenals/urinary tract: Kidneys have a heterogeneous enhancement pattern suggesting cysts and scarring. The left kidney is larger than the right. There is excretion of contrast from the larger left kidney suggesting recent intravascular procedure. Foley catheter within the bladder  Stomach/Bowel: There is fluid in the stomach. The small bowel is grossly normal. There is fluid within the ascending colon. The rectum is grossly normal.  Vascular/Lymphatic: Abdominal aorta is calcified. No evidence of aneurysm. No adenopathy.  Reproductive: Uterus is grossly normal.  Musculoskeletal: No aggressive osseous lesion. 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  Other: There is a large retroperitoneal fluid collection extending from the left inguinal region to the level of the inferior pole of the left kidney. This fluid collection has a fluid fluid level consistent with a hematocrit effect of large retroperitoneal hemorrhage. The hemorrhage measures 18 cm in craniocaudad dimension and 15 x 8.6 cm in axial dimension. Estimated volume of the hemorrhages 1200 cc.  IMPRESSION:  1. Large retroperitoneal hematoma extending from the left inguinal region to the left kidney. Estimated volume of hematoma is 1200 cubic cm. 2. Distension of the gallbladder may represent fasting state. 3. Renal cortical scarring and asymmetry. 4. Fluid within the small bowel and large bowel. Findings conveyed topatient's floor Nurse Durwin Reges 09/13/2014 at16:55.   Electronically Signed   By: Suzy Bouchard M.D.   On: 09/13/2014 16:55   Dg Chest Port 1 View  09/13/2014   CLINICAL DATA:  Acute respiratory failure.  EXAM: PORTABLE CHEST - 1 VIEW  COMPARISON:  September 12, 2014.  FINDINGS: Stable cardiomediastinal silhouette. Mild left pleural effusion is again noted and unchanged. No pneumothorax is noted. Right internal jugular catheter line is unchanged in position with distal tip in expected position of the SVC. Endotracheal tube is unchanged in position compared to prior exam with distal tip approximately 5 mm above the carina. It is recommended to be withdrawn at least 2-3 cm. Nasogastric tube is  seen entering the stomach. Right lung is clear.  IMPRESSION: Stable mild left pleural effusion. Endotracheal tube is unchanged in position with distal tip approximately 5 mm above the carina. It should be withdrawn at least 2-3 cm as recommended on prior exam as well. These results will be called to the ordering clinician or representative by the Radiologist Assistant, and communication documented in the PACS or zVision Dashboard.   Electronically Signed   By: Sabino Dick M.D.   On: 09/13/2014 08:16   Dg Abd Portable 1v  09/13/2014   CLINICAL DATA:  Orogastric tube placement  EXAM: PORTABLE ABDOMEN - 1 VIEW  COMPARISON:  Portable exam 1146 hr compared to 09/11/2014  FINDINGS: Rotated to the RIGHT.  Tip of orogastric tube projects over distal gastric antrum.  Bibasilar atelectasis.  Underlying emphysematous changes.  Nonobstructive bowel gas pattern.  Diffuse osseous demineralization.  IMPRESSION: Tip of  orogastric tube projects over distal gastric antrum.   Electronically Signed   By: Lavonia Dana M.D.   On: 09/13/2014 12:07     ASSESSMENT / PLAN:  PULMONARY OETT 10/19>>> A:  Acute hypercarbic respiratory failure  AE COPD improving Tobacco abuse  P:   Continue Full vent support Continue Scheduled albuterol/ipatropium with prn albuterol Daily SBT as indicated    CARDIOVASCULAR CV A:  Acute afib with RVR > recurrent, driven in part by benzo withdrawal PMH HTN Shock> septic? No clear source, cardiogenic? Hemorrhagic? LVEF normal, consider diastolic dysfunction with high I/O ratio and interstitial edema P:  Continue amiodarone Continue stress dose steroids Levophed, vaso, Neo gtt for MAP >65 CVP goal > 10   RENAL A:   Hyponatremia- uncertaion etiology Non-anion gap metabolic acidosis> improved, uncertain etiology AKI > worsening,  Hypokalemia P:   Stop bicarb gtt Monitor BMET and UOP Low dose potassium replacement Will discuss CVVHD with family  GASTROINTESTINAL A:   Ileus P:   OG tube Continue tube feeding later today per protocol SUP: PPI   HEMATOLOGIC A:   Hemorrhagic anemia due to retroperitoneal bleed from lovenox, would not be amenable to surgical repair Iron deficient anemia P:  DVT prophylaxis: scd No anticoagulation Transfuse 1 U PRBC now CT abdomen/pelvis to look for cause of bleed (RP?)  INFECTIOUS A:   Recent UTI but no clear source of active infection Septic shock? No clear source but treating empirically with broad spectrum abx P:   Sputum 10/19>>> Urine 10/16 >> 9K organisms Blood 10/21 >> Resp 10/21 >>  Abx vanc 10/21 >> Abx Zosyn 10/21 >>   ENDOCRINE A:  Hyperglycemia  P:   ICU hyperglycemia protocol Continue HC 50q6 for mineralocorticoid benefit  NEUROLOGIC A:  Toxic metabolic encephalopathy- resolved  Benzodiazepine withdrawal Anxiety  Essential tremor? Sedation needs for vent P:   RASS goal: -1 Continue PAD  protocol with versed and fentanyl WUA daily/ SBT daily     Family updated: 10/22 daughters updated at bedside, she is currently critically ill with severe shock; has an RP bleed.  Overall there is still no one explanation for the severity of her illness.  Clearly she has an RP bleed but her decline started before she was anemic.  No clear source of infection.  Daughters request code status be limited code, no CPR, but full medical support otherwise.   Interdisciplinary Family Meeting: none indicated   TODAY'S SUMMARY: as per family update above   I have personally obtained a history, examined the patient, evaluated laboratory and imaging results, formulated the assessment and plan  and placed orders.   CRITICAL CARE: The patient is critically ill with multiple organ systems failure and requires high complexity decision making for assessment and support, frequent evaluation and titration of therapies, application of advanced monitoring technologies and extensive interpretation of multiple databases. Critical Care Time devoted to patient care services described in this note is 45 minutes.   Roselie Awkward, MD [place] [facility] Pager: [phone] Cell: [phone] If no response, call [phone]    09/14/2014, 9:50 AM

## 2014-09-14 NOTE — Progress Notes (Addendum)
Tube feedings stopped at 2300 due to the following reasons: concern for potential aspiration as evidenced by tube feeding content continuously seen in subglottic suction tubing; increasing gastric residuals (350 mL) despite very low tube feed infusion rate (20 mL/hr); abdominal distention; and no bowel sounds auscultated. Will inform MD and continue to monitor. Johnsie Cancel, RN

## 2014-09-14 NOTE — Progress Notes (Signed)
Transfused 1 unit of blood per MD order. Blood transfused following protocol,  Blood order and blood information verified with another RN [employer]). Patient tolerated blood transfusion well. No reaction suspect. Follow up labs to come. Roxan Hockey, RN

## 2014-09-14 NOTE — Progress Notes (Signed)
CRITICAL VALUE ALERT  Critical value received:  Calcium 6.2  Date of notification:  09/14/2014  Time of notification:  0520  Critical value read back:Yes.    Nurse who received alert:  Johnsie Cancel  MD notified (1st page):  Dr. Nelda Marseille, CCM  Time of first page:  0550  MD notified (2nd page):  Time of second page:  Responding MD:  Dr. Nelda Marseille, CCM  Time MD responded:  [phone]

## 2014-09-14 NOTE — Progress Notes (Signed)
[facility] Progress Note Patient Name: STEPHENE ALEGRIA DOB: October 17, 1941 MRN: [id number]   Date of Service  09/14/2014  HPI/Events of Note  Hand mottled, has pulses  eICU Interventions  Dc aline, follow pulses closely     Intervention Category Intermediate Interventions: Communication with other healthcare providers and/or family  Raylene Miyamoto. 09/14/2014, 10:18 PM

## 2014-09-14 NOTE — Progress Notes (Signed)
[facility] Progress Note Patient Name: Carmen Dean DOB: 06-23-1941 MRN: [id number]   Date of Service  09/14/2014  HPI/Events of Note  No pulses on dopplers per NP.  eICU Interventions  To call vascular surgery on consult and order arterial dopplers.        YACOUB,WESAM 09/14/2014, 11:43 PM

## 2014-09-14 NOTE — Progress Notes (Signed)
Inpatient Diabetes Program Recommendations  AACE/ADA: New Consensus Statement on Inpatient Glycemic Control (2013)  Target Ranges:  Prepandial:   less than 140 mg/dL      Peak postprandial:   less than 180 mg/dL (1-2 hours)      Critically ill patients:  140 - 180 mg/dL   Results for SHARNAE, WINFREE (MRN [id number]) as of 09/14/2014 09:04  Ref. Range 09/13/2014 15:11 09/13/2014 20:59 09/14/2014 00:27 09/14/2014 04:18 09/14/2014 08:32  Glucose-Capillary Latest Range: 70-99 mg/dL 164 (H) 162 (H) 175 (H) 185 (H) 203 (H)   Reason for Assessment: elevated blood sugars, 203mg /dl this am  Diabetes history: none Outpatient Diabetes medications: none Current orders for Inpatient glycemic control: ICU protocol- Novolog 2-6 units q4h Spoke with RN, Teal regarding elevating CBG and CBG of 203mg /dl this am.  Recommend discussion of insulin drip for the patient if next CBG above 200mg /dl.  If it is decided to NOT initiate the ICU protocol, please change orders to the glycemic control order set. Thanks,   Gentry Fitz, RN, BA, MHA, CDE Diabetes Coordinator Inpatient Diabetes Program  [phone] (Team Pager) [phone] [facility]) 09/14/2014 9:07 AM

## 2014-09-14 NOTE — Progress Notes (Signed)
ANTIBIOTIC CONSULT NOTE - INITIAL  Pharmacy Consult for Vancomycin and Zosyn Indication: rule out sepsis  No Known Allergies  Patient Measurements: Height: 5\' 3"  (160 cm) Weight:  (deferred due to critical condition) IBW/kg (Calculated) : 52.4  Vital Signs: Temp: 97.3 F (36.3 C) (10/22 1220) Temp Source: Oral (10/22 1220) Pulse Rate: 61 (10/22 0935) Intake/Output from previous day: 10/21 0701 - 10/22 0700 In: 6576.5 [I.V.:5473.4; Blood:652.5; NG/GT:450.7] Out: 1055 [Urine:100; Emesis/NG output:830] Intake/Output from this shift: Total I/O In: 1496.9 [I.V.:936.9; Blood:360; NG/GT:100; IV Piggyback:100] Out: 0   Labs:  Recent Labs  09/13/14 0400 09/13/14 1500 09/14/14 0415  WBC 9.5 15.2* 19.8*  HGB 7.3* 9.8* 7.8*  PLT 303 217 164  CREATININE 1.32* 1.50* 2.15*   Estimated Creatinine Clearance: 17.6 ml/min (by C-G formula based on Cr of 2.15). No results found for this basename: Letta Median, VANCORANDOM, GENTTROUGH, GENTPEAK, GENTRANDOM, TOBRATROUGH, TOBRAPEAK, TOBRARND, AMIKACINPEAK, AMIKACINTROU, AMIKACIN,  in the last 72 hours   Microbiology: Recent Results (from the past 720 hour(s))  URINE CULTURE     Status: None   Collection Time    09/18/2014  9:28 PM      Result Value Ref Range Status   Specimen Description URINE, RANDOM   Final   Special Requests NONE   Final   Culture  Setup Time     Final   Value: 09/09/2014 15:23     Performed at [facility]     Final   Value: 9,000 COLONIES/ML     Performed at [facility]   Culture     Final   Value: INSIGNIFICANT GROWTH     Performed at [facility]   Report Status 09/10/2014 FINAL   Final  MRSA PCR SCREENING     Status: None   Collection Time    09/09/14  3:39 PM      Result Value Ref Range Status   MRSA by PCR NEGATIVE  NEGATIVE Final   Comment:            The GeneXpert MRSA Assay (FDA     approved for NASAL specimens     only), is one component of a    comprehensive MRSA colonization     surveillance program. It is not     intended to diagnose MRSA     infection nor to guide or     monitor treatment for     MRSA infections.  MRSA PCR SCREENING     Status: None   Collection Time    09/11/14  1:26 PM      Result Value Ref Range Status   MRSA by PCR NEGATIVE  NEGATIVE Final   Comment:            The GeneXpert MRSA Assay (FDA     approved for NASAL specimens     only), is one component of a     comprehensive MRSA colonization     surveillance program. It is not     intended to diagnose MRSA     infection nor to guide or     monitor treatment for     MRSA infections.  CULTURE, BLOOD (ROUTINE X 2)     Status: None   Collection Time    09/13/14  6:45 AM      Result Value Ref Range Status   Specimen Description BLOOD RIGHT A-LINE   Final   Special Requests BOTTLES DRAWN AEROBIC AND ANAEROBIC 5CC  Final   Culture  Setup Time     Final   Value: 09/13/2014 17:50     Performed at [facility]   Culture     Final   Value:        BLOOD CULTURE RECEIVED NO GROWTH TO DATE CULTURE WILL BE HELD FOR 5 DAYS BEFORE ISSUING A FINAL NEGATIVE REPORT     Performed at [facility]   Report Status PENDING   Incomplete  CULTURE, BLOOD (ROUTINE X 2)     Status: None   Collection Time    09/13/14  7:05 AM      Result Value Ref Range Status   Specimen Description BLOOD RIGHT FOOT   Final   Special Requests BOTTLES DRAWN AEROBIC ONLY 5CC   Final   Culture  Setup Time     Final   Value: 09/13/2014 17:59     Performed at [facility]   Culture     Final   Value:        BLOOD CULTURE RECEIVED NO GROWTH TO DATE CULTURE WILL BE HELD FOR 5 DAYS BEFORE ISSUING A FINAL NEGATIVE REPORT     Performed at [facility]   Report Status PENDING   Incomplete  CULTURE, RESPIRATORY (NON-EXPECTORATED)     Status: None   Collection Time    09/13/14  7:24 AM      Result Value Ref Range Status   Specimen Description TRACHEAL ASPIRATE    Final   Special Requests NONE   Final   Gram Stain     Final   Value: MODERATE WBC PRESENT, PREDOMINANTLY PMN     RARE SQUAMOUS EPITHELIAL CELLS PRESENT     NO ORGANISMS SEEN     Performed at [facility]   Culture     Final   Value: NO GROWTH 1 DAY     Performed at [facility]   Report Status PENDING   Incomplete    Medical History: Past Medical History  Diagnosis Date  . Hypertension   . Cancer   . COPD (chronic obstructive pulmonary disease)   . Anginal pain   . Dementia   . CAD (coronary artery disease)     s/p cath and stent in 2004    Medications:  Scheduled:  . antiseptic oral rinse  7 mL Mouth Rinse QID  . aspirin  81 mg Per Tube Daily  . atorvastatin  80 mg Per Tube q1800  . chlorhexidine  15 mL Mouth Rinse BID  . cholecalciferol  5,000 Units Per Tube Daily  . hydrocortisone sodium succinate  50 mg Intravenous Q6H  . insulin aspart  2-6 Units Subcutaneous 6 times per day  . ipratropium-albuterol  3 mL Nebulization Q4H  . multivitamin  5 mL Per Tube Daily  . pantoprazole (PROTONIX) IV  40 mg Intravenous Daily  . piperacillin-tazobactam (ZOSYN)  IV  2.25 g Intravenous 4 times per day  . saccharomyces boulardii  250 mg Oral BID  . sodium chloride  10-40 mL Intracatheter Q12H  . [START ON Sep 17, 2014] vancomycin  500 mg Intravenous Q48H   Assessment: 73 yo female with VDRF, hypotension, possible sepsis, for empiric antibiotics day #2. Patient remains afebrile, WBC continue to trend up to 19.8, SCr continues to worsen to 2.15 (CrCl ~17 ml/min).   Vanc 10/21>> Zosyn 10/21>> CTX 10/16>>10/20  10/21 Resp Cx> ngtd 10/21 BCx x2 > ngtd 10/21 C.diff > 10/16 UCx > insignf. growth  Goal of  Therapy:  Vancomycin trough level 15-20 mcg/ml  Plan:  - Decrease vancomycin dose to 500 mg IV q48h  - Decrease Zosyn to 2.25 gm IV q6h  - Continue to monitor renal function, temp, WBC, C&S, VT as indicated  Harolyn Rutherford, PharmD Clinical Pharmacist -  Resident Pager: [phone] Pharmacy: [phone] 09/14/2014 2:18 PM

## 2014-09-14 NOTE — Progress Notes (Signed)
[facility] PROGRESS NOTE  S: Called to bedside by [facility] MD for evaluation of Mrs. Carmen Dean. It was reported that she had developed pallor in distal RUE where arterial line had been placed 10/22. ELINK had been monitoring this and hand was noted to be mottled so art line was d/c'd. Nurse at bedside upon my arrival reports that she has not been having difficulty identifying a non-invasive BP.  O: BP 131/54  Pulse 46  Temp(Src) 97.3 F (36.3 C) (Axillary)  Resp 20  Ht 5\' 3"  (1.6 m)  Wt 47.2 kg (104 lb 0.9 oz)  BMI 18.44 kg/m2  SpO2 97%  General:  Elderly, critically ill appearing female on vent Neuro:  Sedated on vent HEENT:  [place]/AT Neck:  Supple, no JVD noted Cardiovascular:  Intermittently brady, regular Lungs: unlabored, synchronous with vent, clear to auscultation Abdomen:  Soft, non-tender, non-distended Musculoskeletal: No acute deformity, absent radial pulse RUE to doppler. R hand pale, mottled.  Skin:  Pale, Intact   A/P: Refractory shock Retroperitoneal bleed ? Right radial artery clot  - Vascular surgery ([facility]) consulted - Arterial dopplers - Attempt NIBP monitoring for now as access for radial art line is very limited.    Georgann Housekeeper, ACNP [facility] Pulmonology/Critical Care Pager [phone] or [phone]

## 2014-09-15 ENCOUNTER — Inpatient Hospital Stay (HOSPITAL_COMMUNITY): Payer: Medicare Other

## 2014-09-15 DIAGNOSIS — I998 Other disorder of circulatory system: Secondary | ICD-10-CM

## 2014-09-15 LAB — BASIC METABOLIC PANEL
Anion gap: 31 — ABNORMAL HIGH (ref 5–15)
BUN: 30 mg/dL — ABNORMAL HIGH (ref 6–23)
CALCIUM: 5.9 mg/dL — AB (ref 8.4–10.5)
CO2: 12 mEq/L — ABNORMAL LOW (ref 19–32)
CREATININE: 2.81 mg/dL — AB (ref 0.50–1.10)
Chloride: 75 mEq/L — ABNORMAL LOW (ref 96–112)
GFR calc non Af Amer: 16 mL/min — ABNORMAL LOW (ref >90)
GFR, EST AFRICAN AMERICAN: 18 mL/min — AB (ref >90)
Glucose, Bld: 278 mg/dL — ABNORMAL HIGH (ref 70–99)
Potassium: 4.1 mEq/L (ref 3.7–5.3)
Sodium: 118 mEq/L — CL (ref 137–147)

## 2014-09-15 LAB — CBC WITH DIFFERENTIAL/PLATELET
Band Neutrophils: 7 % (ref 0–10)
Basophils Absolute: 0 10*3/uL (ref 0.0–0.1)
Basophils Relative: 0 % (ref 0–1)
Eosinophils Absolute: 0 10*3/uL (ref 0.0–0.7)
Eosinophils Relative: 0 % (ref 0–5)
HCT: 25.7 % — ABNORMAL LOW (ref 36.0–46.0)
Hemoglobin: 8.7 g/dL — ABNORMAL LOW (ref 12.0–15.0)
Lymphocytes Relative: 2 % — ABNORMAL LOW (ref 12–46)
Lymphs Abs: 0.7 10*3/uL (ref 0.7–4.0)
MCH: 31.3 pg (ref 26.0–34.0)
MCHC: 33.9 g/dL (ref 30.0–36.0)
MCV: 92.4 fL (ref 78.0–100.0)
METAMYELOCYTES PCT: 2 %
MONO ABS: 2.1 10*3/uL — AB (ref 0.1–1.0)
Monocytes Relative: 6 % (ref 3–12)
NEUTROS PCT: 83 % — AB (ref 43–77)
NRBC: 1 /100{WBCs} — AB
Neutro Abs: 32.1 10*3/uL — ABNORMAL HIGH (ref 1.7–7.7)
Platelets: 124 10*3/uL — ABNORMAL LOW (ref 150–400)
RBC: 2.78 MIL/uL — ABNORMAL LOW (ref 3.87–5.11)
RDW: 15.3 % (ref 11.5–15.5)
WBC: 34.9 10*3/uL — ABNORMAL HIGH (ref 4.0–10.5)

## 2014-09-15 LAB — GLUCOSE, CAPILLARY
GLUCOSE-CAPILLARY: 194 mg/dL — AB (ref 70–99)
GLUCOSE-CAPILLARY: 250 mg/dL — AB (ref 70–99)
GLUCOSE-CAPILLARY: 310 mg/dL — AB (ref 70–99)
GLUCOSE-CAPILLARY: 344 mg/dL — AB (ref 70–99)
Glucose-Capillary: 300 mg/dL — ABNORMAL HIGH (ref 70–99)
Glucose-Capillary: 327 mg/dL — ABNORMAL HIGH (ref 70–99)
Glucose-Capillary: 258 mg/dL — ABNORMAL HIGH (ref 70–99)
Glucose-Capillary: 221 mg/dL — ABNORMAL HIGH (ref 70–99)
Glucose-Capillary: 250 mg/dL — ABNORMAL HIGH (ref 70–99)

## 2014-09-15 LAB — PROTIME-INR
INR: 3.34 — AB (ref 0.00–1.49)
Prothrombin Time: 34.1 seconds — ABNORMAL HIGH (ref 11.6–15.2)

## 2014-09-15 LAB — TYPE AND SCREEN
ABO/RH(D): O NEG
Antibody Screen: NEGATIVE
Unit division: 0
Unit division: 0
Unit division: 0

## 2014-09-15 LAB — CULTURE, RESPIRATORY

## 2014-09-15 LAB — CULTURE, RESPIRATORY W GRAM STAIN

## 2014-09-15 LAB — APTT: aPTT: 45 seconds — ABNORMAL HIGH (ref 24–37)

## 2014-09-15 MED ORDER — MORPHINE SULFATE 10 MG/ML IJ SOLN
10.0000 mg/h | INTRAVENOUS | Status: DC
  Administered 2014-09-15: 10 mg/h via INTRAVENOUS
  Filled 2014-09-15: qty 10

## 2014-09-15 MED ORDER — NOREPINEPHRINE BITARTRATE 1 MG/ML IV SOLN
2.0000 ug/min | INTRAVENOUS | Status: DC
  Filled 2014-09-15: qty 16

## 2014-09-15 MED ORDER — SODIUM CHLORIDE 0.9 % IV SOLN
INTRAVENOUS | Status: DC
  Administered 2014-09-15: 1.9 [IU]/h via INTRAVENOUS
  Filled 2014-09-15: qty 2.5

## 2014-09-15 MED ORDER — MORPHINE BOLUS VIA INFUSION
5.0000 mg | INTRAVENOUS | Status: DC | PRN
  Filled 2014-09-15: qty 20

## 2014-09-19 LAB — CULTURE, BLOOD (ROUTINE X 2)
CULTURE: NO GROWTH
Culture: NO GROWTH

## 2014-09-23 NOTE — Discharge Summary (Signed)
NAMEMarland Dean  [name], [name] NO.:  [id number]  MEDICAL RECORD NO.:  [id number]  LOCATION:  2H11C                        FACILITY:  [facility]  PHYSICIAN:  Norlene Campbell, MDDATE OF BIRTH:  1941-05-27  DATE OF ADMISSION:  08/30/2014 DATE OF DISCHARGE:  2014/09/26                              DISCHARGE SUMMARY   DEATH SUMMARY  CAUSE OF DEATH: 1. Retroperitoneal hematoma. 2. Acute kidney injury. 3. Urinary tract infection. 4. Acute exacerbation of chronic obstructive pulmonary disease.  HISTORY OF PRESENT ILLNESS:  This is a 73 year old with a past medical history significant for COPD and asthma who was admitted on September 08, 2014, with AFib with RVR, UTI, hyponatremia, and encephalopathy.  These had improved as of September 10, 2014.  However, she developed worsening shortness of breath and discomfort and was placed on BiPAP for hypercapnic respiratory failure on September 11, 2014.  She failed BiPAP and was intubated and sent to the ICU.  PAST MEDICAL HISTORY:  See the admission H and P.  FAMILY HISTORY:  See the admission H and P.  SOCIAL HISTORY:  See the admission H and P.  PHYSICAL EXAMINATION:  VITAL SIGNS:  On the date of death, temperature 97.9, heart rate 55, respirations 20, blood pressure 87/60, SpO2 94%. GENERAL:  Cachectic, sedated on vent. HEENT:  [place]/AT, ETT is in place. PULMONARY:  Good air movement.  No wheezing. CARDIOVASCULAR:  Regular rate and rhythm.  No murmurs. ABDOMEN:  Bowel sounds are positive.  Soft, nontender. EXTREMITIES:  Right hand is warm but cyanotic with slow cap refill.  No edema. NEURO:  Asleep, stirs to touch.  HOSPITAL COURSE:  The patient was initially admitted to the [facility] for further management of AFib with RVR, hyponatremia, encephalopathy, and atrial fibrillation.  However, despite adequate treatment for these and an initial response, she did develop worsening shortness of breath and was intubated  as noted in the history above.  She was sent to the ICU.  Within 24 hours of admission to the ICU, she developed a worsening metabolic acidosis and was found to be in acute kidney injury.  She also developed profound shock and anemia.  She was found to have a retroperitoneal hematoma which was felt to be related to anticoagulation which had been previously administered for atrial fibrillation.  Anticoagulation was held, and the patient was resuscitated with blood products.  Her hemoglobin stabilized, but her metabolic acidosis and shock continued.  She was treated aggressively for septic shock, and we looked for cardiac causes by performing echocardiogram.  However, there is no clear cause of cardiogenic shock. The family was updated on a daily basis while she was in the ICU regarding the severity of her illness, and they informed us that she was fairly frail at baseline with multiple medical problems.  After 4 days of mechanical ventilation and worsening overall organ function, we discussed the possibility of dialysis.  However, unfortunately her blood pressure is too low to tolerate even CVVHD, and so with further discussion with the family, they elected to withdraw care while maintaining her comfort.  She died peacefully with family at the bedside on 09/26/2014.  ______________________________ Norlene Campbell, MD     DBM/MEDQ  D:  09/22/2014  T:  09/23/2014  Job:  [id number]

## 2014-09-24 NOTE — Progress Notes (Signed)
Patient expired on vent at 13:13. Patient with no pulse or heart beat for greater then one minute, asculatated by myself and Berniece Salines, RN. Dr Lake Bells notified of patients death. Versed 25cc, Fentanyl 150cc Morpine 90cc wasted down sink by myself and Berniece Salines, RN. Family, including husband at bedside. [place] donor also notified and patient not suitable for donation.

## 2014-09-24 NOTE — Progress Notes (Signed)
Chaplain responded to page from nurse that family needed support for withdrawal of life support. Pt's husband, daughters, and other family at beside. Many were tearful. Family requested prayer. Chaplain and family joined hands in a circle and chaplain prayed for God's presence and support. Afterward, Chaplain asked if family would like chaplain to be present when care is withdrawn. Family expressed gratitude for prayer and said they did not need further support from chaplain at this time.  Will follow as needed.    10/06/14 1120  Clinical Encounter Type  Visited With Family  Visit Type Spiritual support;Other (Comment) (Withdrawing life support)  Referral From Nurse;Family  Consult/Referral To Chaplain  Spiritual Encounters  Spiritual Needs Grief support;Prayer  Stress Factors  Family Stress Factors Loss   Mariah Milling, Chaplain 2014-10-06 11:25 AM

## 2014-09-24 NOTE — Progress Notes (Signed)
Upon assessment at 2200 pt RUE was mottled and cool. Notified MD in [place] and he ordered to D/C aline that was in place. Pulses were palpable at 2000. I removed the aline and assessed for pulses. No palpable pulses were noted. I was able to doppler Brachial pulses but unable to doppler radial pulses. 2 other RNs assessed and eLink informed. NP came to bedside to assess and was unable to doppler Right Radial pulses as well. Vascular surgery was consulted and came to bedside to assess the pt. No new interventions were placed. We are obtaining cuff pressures at this time. Will continue to assess and monitor the pt closely.

## 2014-09-24 NOTE — Progress Notes (Signed)
**Note De-Identified  Obfuscation** Patient extubated after expiring on vent

## 2014-09-24 NOTE — Consult Note (Signed)
[facility]  Subjective  - No overall change still very shocked pressor dependent   Objective 94/74 25 97.9 F (36.6 C) (Oral) 20 97%  Intake/Output Summary (Last 24 hours) at 2014/09/21 0820 Last data filed at 21-Sep-2014 0600  Gross per 24 hour  Intake 4596.57 ml  Output      0 ml  Net 4596.57 ml   Right hand less dusky, ulnar doppler, no radial doppler, digits 3 4 5  less dusky than 1 2    Assessment/Planning: Improved perfusion of hand.  Still in no shape for any vascular intervention.  Supportive care, avoid needle sticks wean pressor if possible, keep hand warm Coags also show severe coagulapathy would suggest completely correcting these in order to prevent progression or RP hematoma Multisystem organ failure per CCM  Carmen Dean E 09/21/14 8:20 AM --  Laboratory Lab Results:  Recent Labs  09/14/14 1555 09-21-2014 0450  WBC 31.0* 34.9*  HGB 10.3* 8.7*  HCT 29.0* 25.7*  PLT 142* 124*   BMET  Recent Labs  09/14/14 0415 2014-09-21 0450  NA 125* 118*  K 3.3* 4.1  CL 83* 75*  CO2 24 12*  GLUCOSE 188* 278*  BUN 29* 30*  CREATININE 2.15* 2.81*  CALCIUM 6.2* 5.9*    COAG Lab Results  Component Value Date   INR 3.34* 2014-09-21   INR 1.13 09/11/2014   No results found for this basename: PTT

## 2014-09-24 NOTE — Progress Notes (Signed)
PULMONARY / CRITICAL CARE MEDICINE   Name: Carmen Dean MRN: [id number] DOB: 1941/07/16    ADMISSION DATE:  09/01/2014 CONSULTATION DATE:  10/19  REFERRING MD :  [facility] CHIEF COMPLAINT:  Hypercarbic Respiratory failure    HISTORY OF PRESENT ILLNESS:   73 yo with a history of COPD and asthma who was admitted on 10/16 for A fib with RVR, UTI, hyponatremia and encephalopathy. These had all improved as of 10/18. On 10/19 she had increasing WOB and discomfort and placed on biPAP for hypercapnic respiratory failure. Failed biPAP and intubated.    STUDIES:  Echo 10/19: Left ventricle: The cavity size was normal. Wall thickness was normal. Systolic function was normal. The estimated ejection fraction was in the range of 60% to 65%. Wall motion was normal there were no regional wall motion abnormalities. Left ventricular diastolic function parameters were normal.Aortic valve: There was trivial regurgitation.Mitral valve: There was mild regurgitation.Left atrium: The atrium was mildly dilated.Tricuspid valve: There was moderate regurgitation. CT angio chest 10/20 >> emphysema, no PE, small effusions CT Abdomen> large retroperitoneal bleed, gall bladder distension  SIGNIFICANT EVENTS: 10/19: intubated  10/20: Anxious this morning, continued shock, recurrent a-fib with rvr 10/21 > worsening shock, acidosis, hgb drop, improved HR with benzo, family reports high benzo intake at home 10/22 > anuric, R hand cyanotic with minimal pulses so radial arterial line removed  SUBJECTIVE:  Max dose pressors, anuric, R hand cyanotic with minimal pulses so radial arterial line removed   VITAL SIGNS: Temp:  [94.4 F (34.7 C)-98 F (36.7 C)] 97.9 F (36.6 C) (10/23 0725) Pulse Rate:  [25-76] 25 (10/23 0330) Resp:  [20] 20 (10/23 0900) BP: (52-157)/(33-129) 87/60 mmHg (10/23 0900) SpO2:  [72 %-97 %] 94 % (10/23 0825) FiO2 (%):  [40 %] 40 % (10/23 0825) Weight:  [68 kg (149 lb 14.6 oz)] 68 kg (149 lb  14.6 oz) (10/23 0456) HEMODYNAMICS: CVP:  [10 mmHg-17 mmHg] 13 mmHg VENTILATOR SETTINGS: Vent Mode:  [-] PRVC FiO2 (%):  [40 %] 40 % Set Rate:  [20 bmp] 20 bmp Vt Set:  [500 mL] 500 mL PEEP:  [5 cmH20] 5 cmH20 Plateau Pressure:  [20 cmH20-23 cmH20] 20 cmH20 INTAKE / OUTPUT:  Intake/Output Summary (Last 24 hours) at 01-Oct-2014 0943 Last data filed at 2014/10/01 0900  Gross per 24 hour  Intake 4891.39 ml  Output      0 ml  Net 4891.39 ml    PHYSICAL EXAMINATION: General:  Sedated on vent HEENT: NCAT, ETT in place PULM : Good air movement, no wheezing CV: Regular rate and rhythm, no murmur AB: BS+, soft, nontender Ext: R hand warm but cyanotic with slow cap refill, no edema Neuro: asleep, stirs to touch  LABS:  CBC  Recent Labs Lab 09/14/14 0415 09/14/14 1555 01-Oct-2014 0450  WBC 19.8* 31.0* 34.9*  HGB 7.8* 10.3* 8.7*  HCT 22.1* 29.0* 25.7*  PLT 164 142* 124*   Coag's  Recent Labs Lab 09/11/14 0417 2014-10-01 0130  APTT 37 45*  INR 1.13 3.34*   BMET  Recent Labs Lab 09/13/14 1500 09/14/14 0415 10/01/2014 0450  NA 130* 125* 118*  K 3.6* 3.3* 4.1  CL 90* 83* 75*  CO2 21 24 12*  BUN 26* 29* 30*  CREATININE 1.50* 2.15* 2.81*  GLUCOSE 166* 188* 278*   Electrolytes  Recent Labs Lab 09/10/14 0326 09/11/14 0417  09/13/14 1500 09/14/14 0415 2014-10-01 0450  CALCIUM 8.8 8.2*  < > 6.7* 6.2* 5.9*  MG 1.6 1.9  --   --   --   --   < > =  values in this interval not displayed. Sepsis Markers  Recent Labs Lab 09/11/14 1858 09/12/14 0410 09/12/14 1550 09/13/14 0400 09/13/14 0525  LATICACIDVEN  --   --  1.9  --  5.4*  PROCALCITON <0.10 <0.10  --  <0.10  --    ABG  Recent Labs Lab 09/13/14 0402 09/13/14 0625 09/14/14 0830  PHART 7.215* 7.207* 7.493*  PCO2ART 44.0 53.5* 34.9*  PO2ART 91.1 88.2 88.0   Liver Enzymes  Recent Labs Lab  1958 09/10/14 0326  AST 29 24  ALT 19 17  ALKPHOS 90 73  BILITOT 0.4 <0.2*  ALBUMIN 3.5 2.6*    Cardiac Enzymes  Recent Labs Lab 09/11/14 1624  09/11/14 2146 09/12/14 0410 09/13/14 0630  TROPONINI  --   < > <0.30 <0.30 <0.30  PROBNP 3157.0*  --   --   --   --   < > = values in this interval not displayed. Glucose  Recent Labs Lab 10-13-14 0021 13-Oct-2014 0232 October 13, 2014 0345 10/13/14 0446 13-Oct-2014 0554 10-13-2014 0909  GLUCAP 310* 250* 194* 300* 327* 221*    Imaging Dg Chest Port 1 View  09/14/2014   CLINICAL DATA:  Acute respiratory failure, hypoxemia, shortness of breath, intubated patient  EXAM: PORTABLE CHEST - 1 VIEW  COMPARISON:  Portable chest x-ray of September 13, 2014  FINDINGS: The endotracheal tube has been repositioned such that the tip now lies 3 cm above the crotch of the carina. The lungs are adequately inflated. There is no focal infiltrate. The hemidiaphragms are less well demonstrated today consistent with small amounts of pleural fluid. The heart and pulmonary vascularity are normal. The esophagogastric tube tip projects below the inferior margin of the image. The right internal jugular venous catheter tip projects over the midportion of the SVC.  IMPRESSION: 1. Appropriate repositioning of the endotracheal tube has been performed with the tip now lying 3 cm above the crotch of the carina. 2. COPD without focal infiltrate. Increased density at the lung bases is stable and is consistent with atelectasis and small amounts of pleural fluid.   Electronically Signed   By: David  Martinique   On: 09/14/2014 07:48     ASSESSMENT / PLAN:  PULMONARY OETT 10/19>>> A:  Acute hypercarbic respiratory failure  AE COPD improving Tobacco abuse P:   Continue Full vent support Continue Scheduled albuterol/ipatropium with prn albuterol  CARDIOVASCULAR CVL R IJ 10/20  >  A:  Acute afib with RVR > recurrent, driven in part by benzo withdrawal Worsening Shock> septic? No clear source, cardiogenic? Due to acidosis? P:  Continue amiodarone Continue stress dose  steroids Levophed, vaso, Neo gtt for MAP >65 CVP goal > 10 Check lactic acid   RENAL A:   Hyponatremia- worsening, presumably SIADH Gap metabolic acidosis> worsening AKI > worsening, pressure too unstable to perform CVVHD P:   Monitor BMET and UOP Low dose potassium replacement   GASTROINTESTINAL A:   Ileus, not tolerating tube feeding P:   OG tube Continue tube feeding later today per protocol SUP: PPI   HEMATOLOGIC A:   Hemorrhagic anemia due to retroperitoneal bleed from lovenox > stable Iron deficient anemia P:  DVT prophylaxis: scd No anticoagulation Transfuse 1 U PRBC now  INFECTIOUS A:   Recent UTI but no clear source of active infection Septic shock? No clear source but treating empirically with broad spectrum abx P:   Sputum 10/19>>> Urine 10/16 >> 9K organisms Blood 10/21 >> Resp 10/21 >>  Abx vanc 10/21 >> Abx  Zosyn 10/21 >>   ENDOCRINE A:  Hyperglycemia  P:   ICU hyperglycemia protocol Continue HC 50q6 for mineralocorticoid benefit  NEUROLOGIC A:  Acute encephalopathy Benzodiazepine withdrawal Anxiety  Essential tremor? Sedation needs for vent P:   RASS goal: -1 Continue PAD protocol with versed and fentanyl WUA daily/ SBT daily     Family updated: Family updated at length this morning regarding severe decline, multi-organ failure and extremely poor prognosis.  She would not tolerate hemodialysis. Based on this conversation we have decided to withdraw care today.  Code status DNR  Interdisciplinary Family Meeting: none indicated   TODAY'S SUMMARY: as per family update above   I have personally obtained a history, examined the patient, evaluated laboratory and imaging results, formulated the assessment and plan and placed orders.   CRITICAL CARE: The patient is critically ill with multiple organ systems failure and requires high complexity decision making for assessment and support, frequent evaluation and titration of  therapies, application of advanced monitoring technologies and extensive interpretation of multiple databases. Critical Care Time devoted to patient care services described in this note is 40 minutes.   Roselie Awkward, MD [facility] Pager: [phone] Cell: [phone] If no response, call [phone]    Sep 27, 2014, 9:43 AM

## 2014-09-24 NOTE — Care Management Note (Addendum)
    Page 1 of 1   2014/10/01     10:10:17 AM CARE MANAGEMENT NOTE 2014/10/01  Patient:  Carmen Dean, Carmen Dean   Account Number:  [id number]  Date Initiated:  09/12/2014  Documentation initiated by:  Elissa Hefty  Subjective/Objective Assessment:   adm w at fib  vent     Action/Plan:   lives w husband   Anticipated DC Date:     Anticipated DC Plan:           Choice offered to / List presented to:             Status of service:   Medicare Important Message given?  YES (If response is "NO", the following Medicare IM given date fields will be blank) Date Medicare IM given:  09/12/2014 Medicare IM given by:  Elissa Hefty Date Additional Medicare IM given:  October 01, 2014 Additional Medicare IM given by:  [facility] Juvencio Verdi  Discharge Disposition:    Per UR Regulation:  Reviewed for med. necessity/level of care/duration of stay  If discussed at [place] of Stay Meetings, dates discussed:   09/14/2014    Comments:

## 2014-09-24 NOTE — Progress Notes (Signed)
CRITICAL VALUE ALERT  Critical value received:  Na 118 & Ca 5.9  Date of notification:  Sep 29, 2014  Time of notification:  0600  Critical value read back: yes   Nurse who received alert:  J.Tarri Guilfoil   MD notified (1st page):  E-Link  Time of first page:  0602  MD notified (2nd page):  Time of second page:  Responding MD:  E-Link  Time MD responded:  [phone]

## 2014-09-24 NOTE — Consult Note (Signed)
[facility] [place] HISTORY AND PHYSICAL  Requesting: Critical care service Reason for consult: right hand ischemia History of Present Illness:  Patient is a 73 y.o. year old female who presents for evaluation of right hand ischemia.  Pt had an a line in the right radial artery and has had progressive coolness of right hand and duskiness progressive over the course of the day.  A line was removed approximately 90 min ago.  No doppler flow noted to right hand.  Pt is in severe shock requiring vasopressin levophed neo.  She is on a ventilator and unresponsive.  She was admitted several days ago with UTI and afib with RVR.  She has subsequently developed spontaneous retroperitoneal bleed while in hospital requiring transfusion.  Presumably bleed secondary to anticoagulation she received earlier in hospital course.  Past Medical History  Diagnosis Date  . Hypertension   . Cancer   . COPD (chronic obstructive pulmonary disease)   . Anginal pain   . Dementia   . CAD (coronary artery disease)     s/p cath and stent in 2004    Past Surgical History  Procedure Laterality Date  . Mastectomy      Social History History  Substance Use Topics  . Smoking status: Former Research scientist (life sciences)  . Smokeless tobacco: Not on file     Comment: smoked for 30-40 yrs, 1ppd, quit 5 yrs ago  . Alcohol Use: Yes     Comment: occasional, 1-2 beers a wk    Family History Family History  Problem Relation Age of Onset  . Bleeding Disorder Father 68    died of internal bleeding  . Hypertension Mother   . Hypertension Brother     Allergies  No Known Allergies   Current Facility-Administered Medications  Medication Dose Route Frequency Provider Last Rate Last Dose  . 0.9 %  sodium chloride infusion   Intravenous Continuous Verlee Monte, MD 10 mL/hr at 09/13/14 0600    . acetaminophen (TYLENOL) tablet 650 mg  650 mg Oral Q6H PRN Theressa Millard, MD   650 mg at 09/10/14 0931   Or  .  acetaminophen (TYLENOL) suppository 650 mg  650 mg Rectal Q6H PRN Theressa Millard, MD      . amiodarone (NEXTERONE PREMIX) 360 MG/200ML (1.8 mg/mL) IV infusion  30 mg/hr Intravenous Continuous Juanito Doom, MD 16.7 mL/hr at 09/14/14 1841 30 mg/hr at 09/14/14 1841  . antiseptic oral rinse (CPC / CETYLPYRIDINIUM CHLORIDE 0.05%) solution 7 mL  7 mL Mouth Rinse QID Erick Colace, NP   7 mL at 10/10/2014 0026  . aspirin chewable tablet 81 mg  81 mg Per Tube Daily Erick Colace, NP   81 mg at 09/14/14 1022  . atorvastatin (LIPITOR) tablet 80 mg  80 mg Per Tube q1800 Erick Colace, NP   80 mg at 09/14/14 1800  . chlorhexidine (PERIDEX) 0.12 % solution 15 mL  15 mL Mouth Rinse BID Erick Colace, NP   15 mL at 09/14/14 2002  . cholecalciferol (VITAMIN D) tablet 5,000 Units  5,000 Units Per Tube Daily Erick Colace, NP   5,000 Units at 09/14/14 1022  . feeding supplement (VITAL AF 1.2 CAL) liquid 1,000 mL  1,000 mL Per Tube Continuous Heather Cornelison Pitts, RD 30 mL/hr at 09/14/14 2251 1,000 mL at 09/14/14 2251  . fentaNYL (SUBLIMAZE) 2,500 mcg in sodium chloride 0.9 % 250 mL (10 mcg/mL) infusion  0-400 mcg/hr Intravenous Continuous Rose Fillers  Byrum, MD 15 mL/hr at 09/14/14 2206 150 mcg/hr at 09/14/14 2206  . fentaNYL (SUBLIMAZE) bolus via infusion 25-50 mcg  25-50 mcg Intravenous Q1H PRN Collene Gobble, MD      . fentaNYL (SUBLIMAZE) injection 25-100 mcg  25-100 mcg Intravenous Q2H PRN Rigoberto Noel, MD   50 mcg at 09/11/14 1705  . hydrocortisone sodium succinate (SOLU-CORTEF) 100 MG injection 50 mg  50 mg Intravenous Q6H Juanito Doom, MD   50 mg at Oct 05, 2014 0017  . insulin aspart (novoLOG) injection 2-6 Units  2-6 Units Subcutaneous 6 times per day Erick Colace, NP   2 Units at 09/14/14 2011  . ipratropium-albuterol (DUONEB) 0.5-2.5 (3) MG/3ML nebulizer solution 3 mL  3 mL Nebulization Q4H Erick Colace, NP   3 mL at October 05, 2014 0013  . levalbuterol (XOPENEX) nebulizer solution 0.63 mg   0.63 mg Nebulization Q3H PRN Cherene Altes, MD   0.63 mg at 09/11/14 1252  . midazolam (VERSED) 50 mg in sodium chloride 0.9 % 50 mL (1 mg/mL) infusion  1-4 mg/hr Intravenous Continuous Juanito Doom, MD 2 mL/hr at 09/14/14 0200 2 mg/hr at 09/14/14 0200  . midazolam (VERSED) injection 1-2 mg  1-2 mg Intravenous Q2H PRN Juanito Doom, MD   2 mg at 09/14/14 0330  . multivitamin liquid 5 mL  5 mL Per Tube Daily Heather Cornelison Pitts, RD   5 mL at 09/14/14 1022  . norepinephrine (LEVOPHED) 4 mg in dextrose 5 % 250 mL (0.016 mg/mL) infusion  2-50 mcg/min Intravenous Continuous Colbert Coyer, MD 75 mL/hr at 2014-10-05 0038 20 mcg/min at 05-Oct-2014 0038  . pantoprazole (PROTONIX) injection 40 mg  40 mg Intravenous Daily Erick Colace, NP   40 mg at 09/14/14 1023  . phenylephrine (NEO-SYNEPHRINE) 40 mg in dextrose 5 % 250 mL (0.16 mg/mL) infusion  30-200 mcg/min Intravenous Continuous Erick Colace, NP 75 mL/hr at 09/14/14 2146 200 mcg/min at 09/14/14 2146  . piperacillin-tazobactam (ZOSYN) IVPB 2.25 g  2.25 g Intravenous 4 times per day Harolyn Rutherford, RPH   2.25 g at 10/05/14 0019  . saccharomyces boulardii (FLORASTOR) capsule 250 mg  250 mg Oral BID Verlee Monte, MD   250 mg at 09/14/14 2243  . sodium chloride 0.9 % injection 10-40 mL  10-40 mL Intracatheter Q12H Juanito Doom, MD   10 mL at 09/14/14 1022  . sodium chloride 0.9 % injection 10-40 mL  10-40 mL Intracatheter PRN Juanito Doom, MD      . vancomycin (VANCOCIN) 500 mg in sodium chloride 0.9 % 100 mL IVPB  500 mg Intravenous Q48H Erika K von Vajna, RPH      . vasopressin (PITRESSIN) 40 Units in sodium chloride 0.9 % 250 mL (0.16 Units/mL) infusion  0.01-0.04 Units/min Intravenous Continuous Juanito Doom, MD 11.3 mL/hr at 09/14/14 1308 0.03 Units/min at 09/14/14 1308    ROS:   Unable to obtain pt sedated on ventilator  Physical Examination  Filed Vitals:   09/14/14 2029 09/14/14 2200 October 05, 2014 0015  10-05-2014 0025  BP:   119/60   Pulse:    76  Temp:      TempSrc:      Resp:  20 20 20   Height:      Weight:      SpO2: 97%   96%    Body mass index is 18.44 kg/(m^2). SBP 40-80 during my time in room with patient General:  Pt obtunded almost  moribund in appearance Cardiac: Regular Rate and Rhythm  Abdomen: distended firm especially left of midline Skin: No rash Extremity Pulses:  No palpable radial, brachial, femoral, dorsalis pedis, posterior tibial pulses bilaterally, doppler flow right axillary and brachial artery no doppler flow right radial or ulnar artery, doppler flow present left radial but not ulnar,  Feet and hands symmetrically cold, right hand slightly more dusky than left Musculoskeletal: trace edema all extremities  Neurologic: Upper and lower extremity motor does not move in response to stimulus  DATA:   CBC    Component Value Date/Time   WBC 31.0* 09/14/2014 1555   RBC 3.32* 09/14/2014 1555   RBC 3.78* 09/11/2014 0417   HGB 10.3* 09/14/2014 1555   HCT 29.0* 09/14/2014 1555   PLT 142* 09/14/2014 1555   MCV 87.3 09/14/2014 1555   MCH 31.0 09/14/2014 1555   MCHC 35.5 09/14/2014 1555   RDW 14.5 09/14/2014 1555   LYMPHSABS 1.0 09/14/2014 0415   MONOABS 2.3* 09/14/2014 0415   EOSABS 0.0 09/14/2014 0415   BASOSABS 0.0 09/14/2014 0415    BMET    Component Value Date/Time   NA 125* 09/14/2014 0415   K 3.3* 09/14/2014 0415   CL 83* 09/14/2014 0415   CO2 24 09/14/2014 0415   GLUCOSE 188* 09/14/2014 0415   BUN 29* 09/14/2014 0415   CREATININE 2.15* 09/14/2014 0415   CALCIUM 6.2* 09/14/2014 0415   GFRNONAA 22* 09/14/2014 0415   GFRAA 25* 09/14/2014 0415    ASSESSMENT:  1. Right hand ischemia multifactorial with pressor effect as well as possible radial artery thrombosis with no compensation from ulnar artery  2. Multisystem organ failure with shock requiring increasing amounts of pressor to sustain BP  3. Retroperitoneal bleed   PLAN:  Threatened  viability of right hand but overall condition of patient is very unstable currently not a candidate for operation.  Hopefully will improve enough to not require as much pressor to help with hand.  Will follow for now.  Will consider repair only if patient more stable or improved "life over limb" for now  Retroperitoneal bleed most likely spontaneous would check INR/PTT to make sure they are optimized to prevent further bleeding  Ruta Hinds, MD [facility] [place]: [phone] Pager: [phone]

## 2014-09-24 NOTE — Progress Notes (Signed)
CARDIOLOGY CONSULT NOTE   Patient ID: Carmen Dean MRN: [id number], DOB/AGE: 72/28/1942   Admit date: 09/19/2014 Date of Consult: 09/18/2014   Primary Physician: No primary provider on file. Primary Cardiologist: Dr. Cleta Alberts in [facility]  Pt. Profile  73 year old Caucasian female with past medical history of hypertension, COPD on home oxygen, dementia, history of old left breast cancer status post mastectomy in 1998, and a history of coronary artery disease status post stent placement in 2004 presented with a-fib with RVR and subsequently went into resp distress requiring intubation  She was seen initially by cardiology on October 21. She had rapid atrial fibrillation in the setting of a large retroperitoneal hematoma.  She's been transfused. CT of the abdomen confirmed the diagnosis of retroperitoneal bleed. Her vital signs are more stable this morning. She may remain sedated. I talked with her 2 daughters at the bedside today.  She had a right radial arterial line and developed right hand ischemia   Problem List  Past Medical History  Diagnosis Date  . Hypertension   . Cancer   . COPD (chronic obstructive pulmonary disease)   . Anginal pain   . Dementia   . CAD (coronary artery disease)     s/p cath and stent in 2004    Past Surgical History  Procedure Laterality Date  . Mastectomy       Allergies  No Known Allergies  HPI   HPI obtained from medical record and family member as patient is intubated and sedated  The patient is a 73 year old Caucasian female with past medical history of hypertension, COPD on home oxygen, dementia, history of remote left breast cancer status post mastectomy in 1998, and a history of coronary artery disease status post stent placement in 2004. According to family member, she follows Dr. Daphene Jaeger in [place]. Per family, she has no prior history of arrhythmia. Her last visit with her cardiologist was in  August of this year at which time she was still doing okay. For the last several years, patient has been in and out of hospital several times due to dehydration/UTI/deconditioning. According to the family, patient has not been very active in the last several years due to her clinical condition. She has not had any repeat stress test or cardiac catheterization since 2004. She has not experienced any exertional chest pain as far as the family knows. Patient was recently admitted in [place] hospital for UTI and was discharged on 09/01/2014.  Her husband and her drove down to [place] on 09/03/2014 to visit her brother. She was doing well for the first few days after arrival in [place]. However starting on 09/06/2014, family noted patient began to have decreased appetite and weakness. She was sent to [facility] on Friday  due to altered mental status, progressive weakness, and poor appetite. On arrival to the ED, it was noted the patient was in atrial fibrillation with heart rate of 140s. Initial laboratory finding was also significant for severe hyponatremia with sodium level 125 on arrival. She was originally admitted to the medicine service and was placed on full dose Lovenox. She was also placed on diltiazem drip and subsequently converted to normal sinus rhythm on 10/17. Echocardiogram was obtained on 09/10/2014 which showed EF 60-65% normal wall motion, mild MR, moderate TR, mildly dilated left atrium. Patient was doing well until the night of 09/11/2014 when she began to have progressive shortness of breath. Rapid response was called, critical care  group was also consulted, patient was placed on BiPAP, however failed BiPAP and was eventually intubated. CTA of the chest was obtained which showed no pulmonary embolism, atherosclerotic calcification involving aorta and coronary arteries, no dissection or focal aneurysm, severe emphysematous changes, and bilateral pleural  effusion with overlying atelectasis. She subsequently had hypotension requiring combination of pressors including Levophed, Neo-Synephrine and vasopressin. It was also noted, patient had a sudden drop in her hemoglobin. On the night of 10/20, her hemoglobin dropped from 11.1 down to 7.3. Looking back her hemoglobin has been dropping since admission from 13.7 to as low as 6.3. She has received 2 units of blood this morning. Patient had recurrent atrial fibrillation requiring IV amiodarone.   Friday:  She has converted to NSR this am   Inpatient Medications  . antiseptic oral rinse  7 mL Mouth Rinse QID  . aspirin  81 mg Per Tube Daily  . atorvastatin  80 mg Per Tube q1800  . chlorhexidine  15 mL Mouth Rinse BID  . cholecalciferol  5,000 Units Per Tube Daily  . hydrocortisone sodium succinate  50 mg Intravenous Q6H  . ipratropium-albuterol  3 mL Nebulization Q4H  . multivitamin  5 mL Per Tube Daily  . pantoprazole (PROTONIX) IV  40 mg Intravenous Daily  . piperacillin-tazobactam (ZOSYN)  IV  2.25 g Intravenous 4 times per day  . saccharomyces boulardii  250 mg Oral BID  . sodium chloride  10-40 mL Intracatheter Q12H  . vancomycin  500 mg Intravenous Q48H    Family History Family History  Problem Relation Age of Onset  . Bleeding Disorder Father 26    died of internal bleeding  . Hypertension Mother   . Hypertension Brother      Social History History   Social History  . Marital Status: Married    Spouse Name: N/A    Number of Children: N/A  . Years of Education: N/A   Occupational History  . Not on file.   Social History Main Topics  . Smoking status: Former Research scientist (life sciences)  . Smokeless tobacco: Not on file     Comment: smoked for 30-40 yrs, 1ppd, quit 5 yrs ago  . Alcohol Use: Yes     Comment: occasional, 1-2 beers a wk  . Drug Use: No  . Sexual Activity: Not Currently   Other Topics Concern  . Not on file   Social History Narrative  . No narrative on file      Physical Exam  Blood pressure 87/60, pulse 25, temperature 97.9 F (36.6 C), temperature source Oral, resp. rate 20, height 5\' 3"  (1.6 m), weight 149 lb 14.6 oz (68 kg), SpO2 94.00%.  General: intubated Neuro: sedated HEENT: Normal  Neck: Supple without bruits or JVD. Lungs:  Resp regular and unlabored, anterior exam CTA. On vent Heart: RRR   Abdomen: abdomen is slightly distended /  Firm , - BS Extremities: No clubbing, cyanosis or edema. DP/PT/Radials 2+ and equal bilaterally.  Labs   Recent Labs  09/13/14 0630  TROPONINI <0.30   Lab Results  Component Value Date   WBC 34.9* 09/17/14   HGB 8.7* 09-17-14   HCT 25.7* 17-Sep-2014   MCV 92.4 09/17/14   PLT 124* 09-17-14    Recent Labs Lab 09/10/14 0326  September 17, 2014 0450  NA 128*  < > 118*  K 4.9  < > 4.1  CL 98  < > 75*  CO2 19  < > 12*  BUN 16  < > 30*  CREATININE 0.80  < > 2.81*  CALCIUM 8.8  < > 5.9*  PROT 5.9*  --   --   BILITOT <0.2*  --   --   ALKPHOS 73  --   --   ALT 17  --   --   AST 24  --   --   GLUCOSE 112*  < > 278*  < > = values in this interval not displayed. No results found for this basename: CHOL,  HDL,  LDLCALC,  TRIG    Radiology/Studies  Ct Angio Chest Pe W/cm &/or Wo Cm  09/12/2014   CLINICAL DATA:  Multi organ system failure. COPD. Decreasing oxygen saturation.  EXAM: CT ANGIOGRAPHY CHEST WITH CONTRAST  TECHNIQUE: Multidetector CT imaging of the chest was performed using the standard protocol during bolus administration of intravenous contrast. Multiplanar CT image reconstructions and MIPs were obtained to evaluate the vascular anatomy.  CONTRAST:  169mL OMNIPAQUE IOHEXOL 350 MG/ML SOLN  COMPARISON:  None.  FINDINGS: Chest wall: No breast masses, supraclavicular or axillary lymphadenopathy. The patient has had a left mastectomy. The thyroid gland is grossly normal. The bony thorax is intact. No destructive bone lesions or spinal canal compromise.  Mediastinum: The endotracheal tube is  in good position at the mid tracheal level. There is an NG tube coursing down into the stomach. The heart is normal in size. No pericardial effusion. No mediastinal or hilar mass or adenopathy. Small scattered lymph nodes are noted. There is advanced atherosclerotic calcifications involving the aorta and coronary arteries but no focal aneurysm or dissection.  The pulmonary arterial tree is fairly well opacified. No filling defects are identified to suggest pulmonary embolism.  Lungs: There are advanced emphysematous changes along with bilateral pleural effusions and overlying atelectasis. Biapical scarring changes are noted. No focal infiltrates, pulmonary edema or worrisome pulmonary lesions.  Upper abdomen: No significant findings. Advanced atherosclerotic calcifications involving the abdominal aorta and branch vessels.  Review of the MIP images confirms the above findings.  IMPRESSION: 1. No CT findings for pulmonary embolism. 2. Advanced atherosclerotic calcifications involving the aorta and branch vessels including the coronary arteries. No dissection or focal aneurysm. 3. Severe emphysematous changes. 4. Bilateral pleural effusions with overlying atelectasis.   Electronically Signed   By: Kalman Jewels M.D.   On: 09/12/2014 11:56    ECG  Atrial fib with HR 80s.   ASSESSMENT AND PLAN  1. A-fib with RVR - new per family   -pat has PAF. Is in NSR this am.  Not a candidate for anticoagulation.  2. Hypotension: 2nd GI bleed initially. Still very hypotensive on 3 pressers.  3. Severe anemia 4. UTI: treated per primary team 5. Severe hyponatremia 6. AMS on arrival: likely related to #4&5 7. CAD s/p stent in 2004 8. Hypertension 9. COPD on home oxygen 10. Dementia 11. history of old left breast cancer status post mastectomy in 1998  Thayer Headings, Brooke Bonito., MD, [facility] 2014-09-20, 9:25 AM [address]  [place] Pager [phone]

## 2014-09-24 DEATH — deceased

## 2015-12-29 IMAGING — CR DG ABD PORTABLE 1V
1 series · 1 of 1 positions shown · non-contrast
Comparison: Chest radiograph 09/11/2014

CLINICAL DATA: OG tube placement.

EXAM:
PORTABLE ABDOMEN - 1 VIEW

[AP]
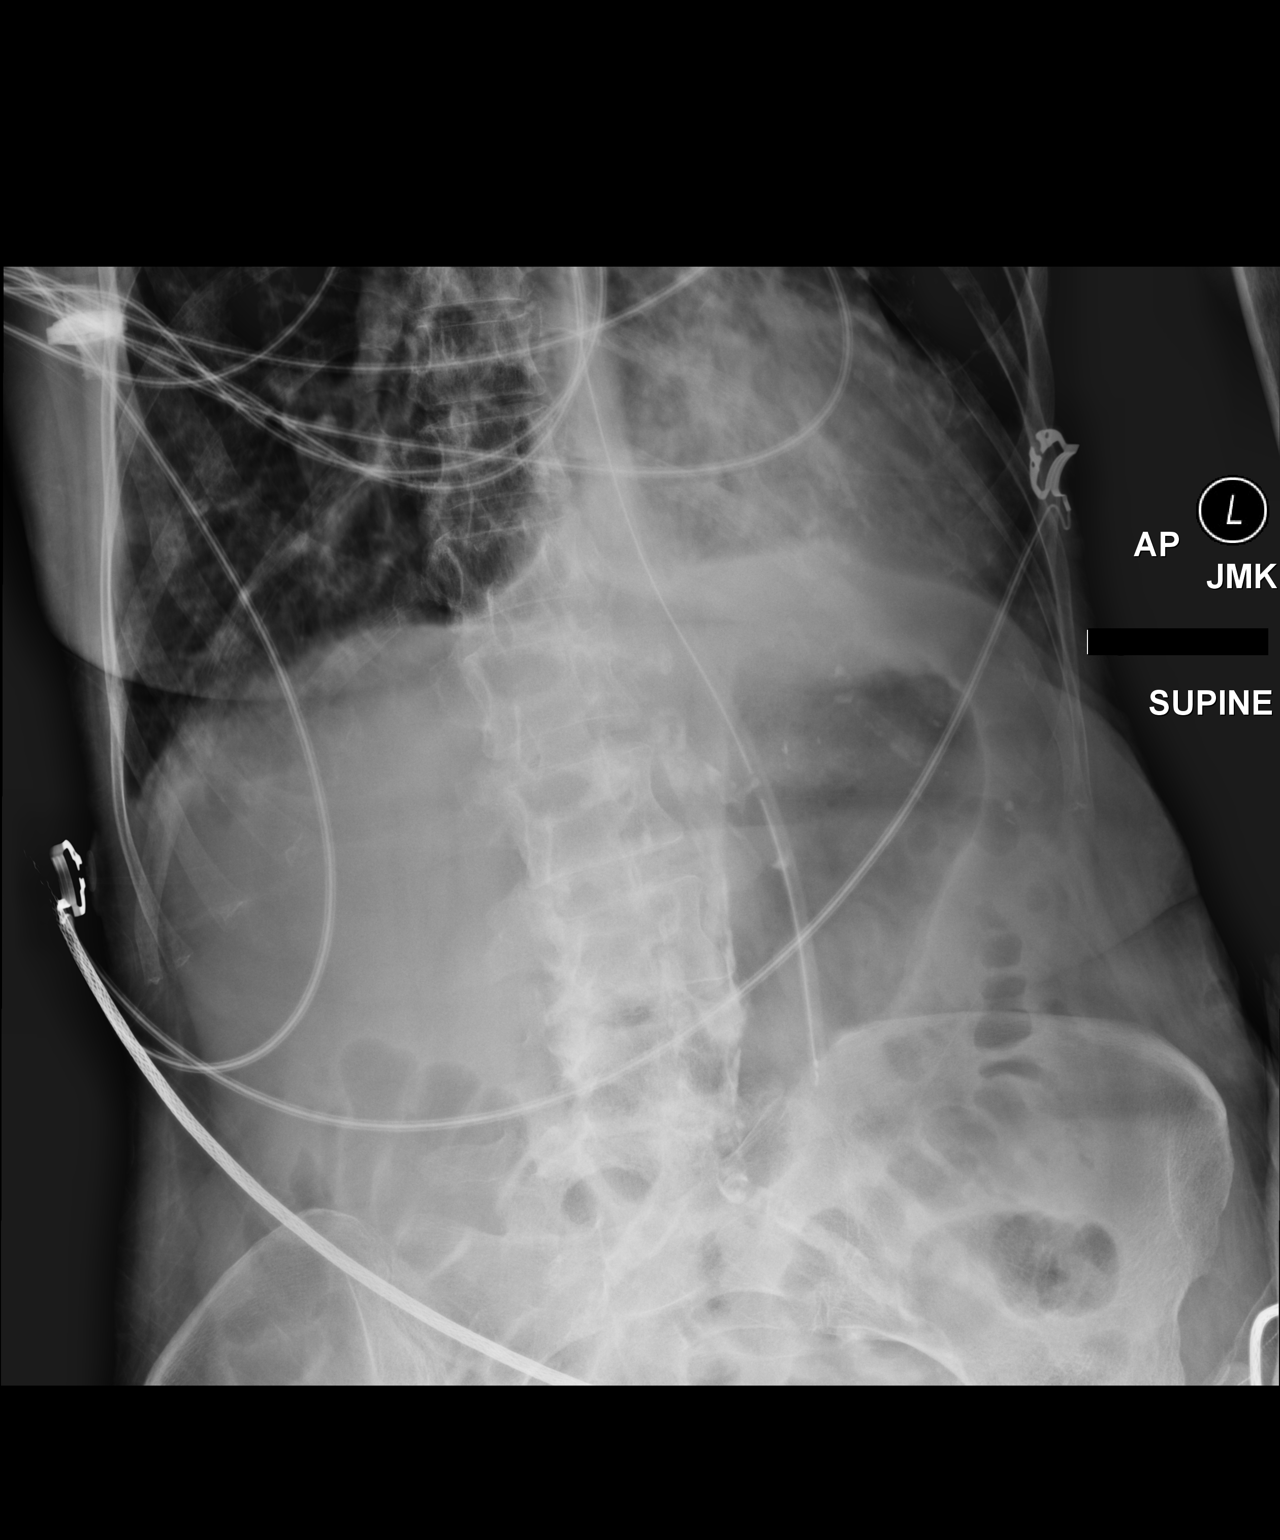

[1 of 1 positions shown; findings below may reference images not displayed]

FINDINGS: The enteric tube terminates in the body of the stomach, in
satisfactory position. There is mild gaseous distention of the
stomach. Gas is seen within nondistended colon and small bowel.
Linear bibasilar opacities are noted.
IMPRESSION: Enteric (orogastric versus nasogastric) tube terminates in the body
of stomach.

## 2015-12-30 IMAGING — CR DG CHEST 1V PORT
1 series · 1 of 1 positions shown · non-contrast
Comparison: 09/11/2014

CLINICAL DATA: Acute respiratory failure

EXAM:
PORTABLE CHEST - 1 VIEW

[portable]
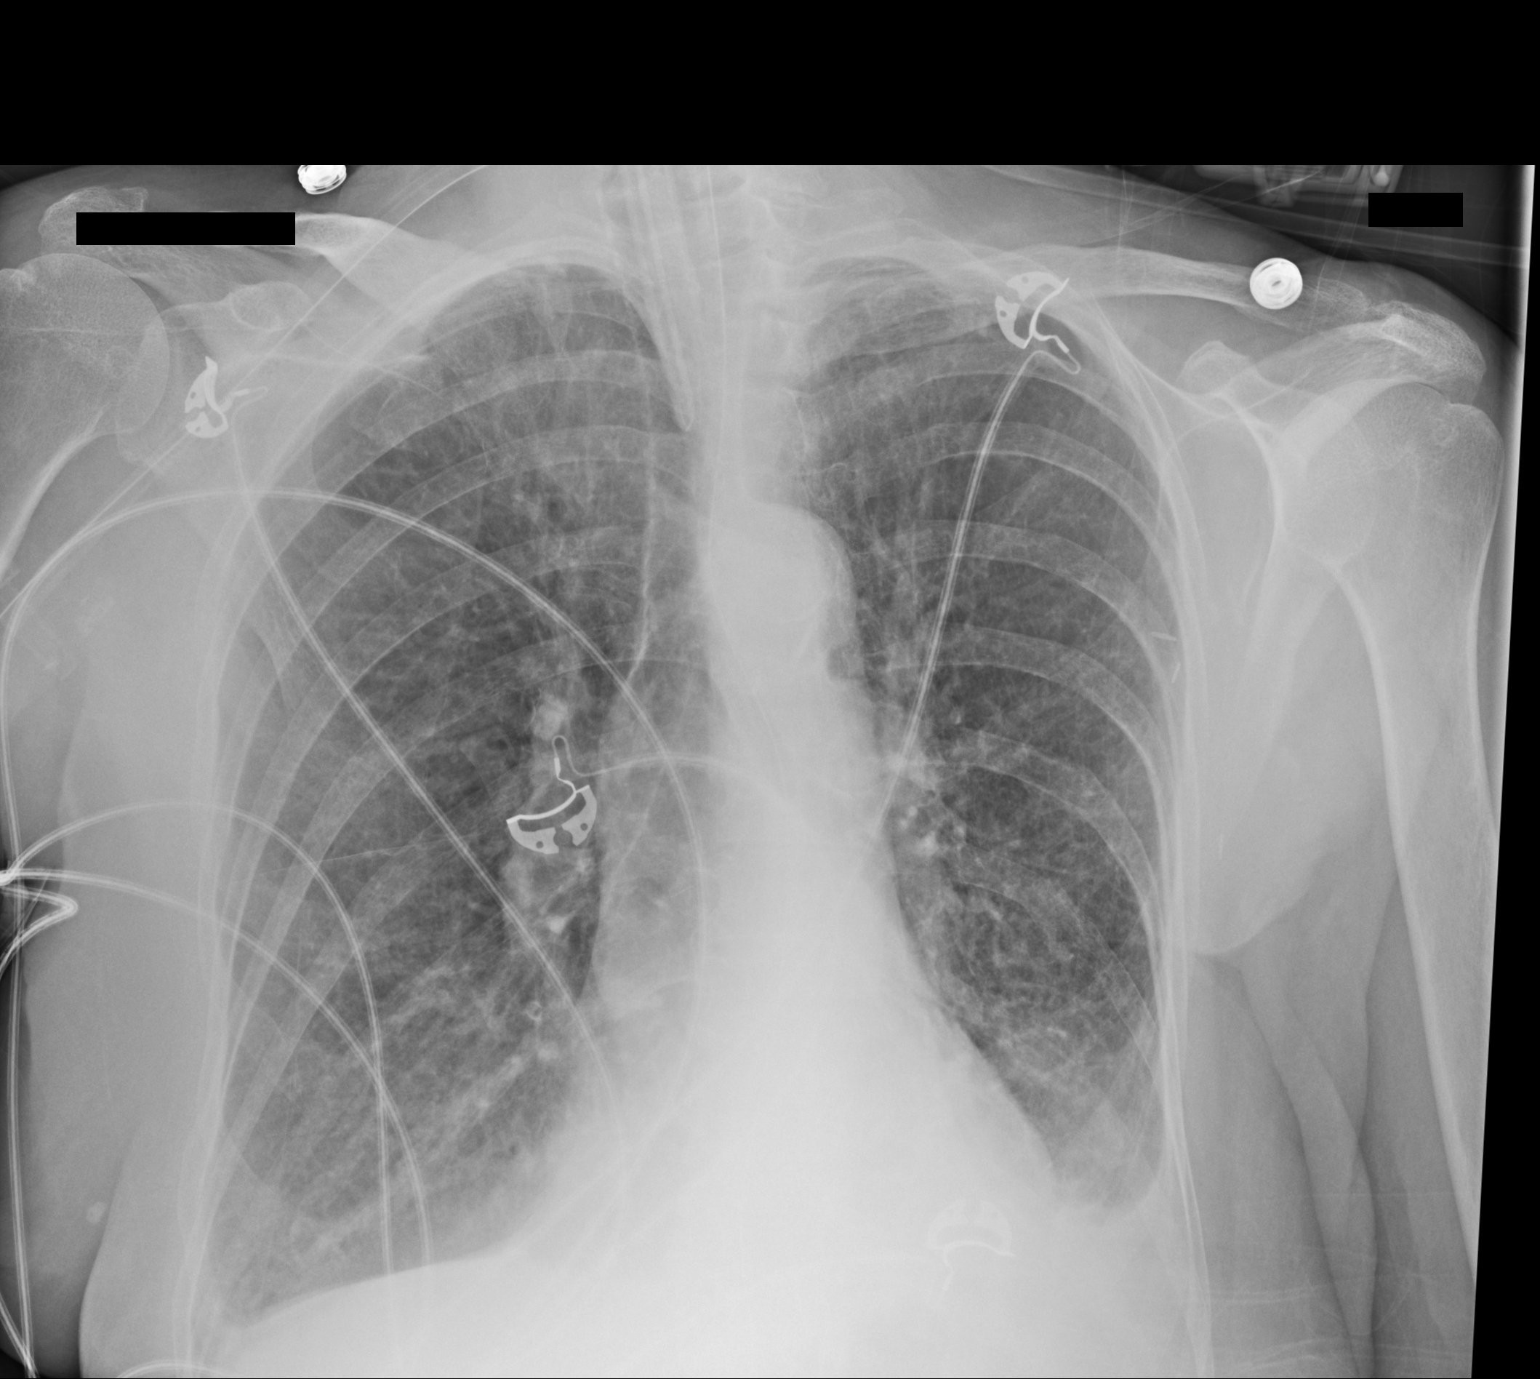

[1 of 1 positions shown; findings below may reference images not displayed]

FINDINGS: Mild interstitial edema with small left pleural effusion. No
pneumothorax.

The heart is normal in size.

Endotracheal tube terminates 6 cm above the carina.

Enteric tube courses below the diaphragm.
IMPRESSION: Endotracheal tube terminates 6 cm above the carina.

Mild interstitial edema with small left pleural effusion
